# Patient Record
Sex: Male | Born: 1940 | Race: White | Hispanic: No | Marital: Married | Smoking: Former smoker
Health system: Southern US, Community
[De-identification: ages and names within clinical notes are randomized; demographics above are authoritative.]

## PROBLEM LIST (undated history)

## (undated) DIAGNOSIS — K30 Functional dyspepsia: Secondary | ICD-10-CM

## (undated) DIAGNOSIS — M1990 Unspecified osteoarthritis, unspecified site: Secondary | ICD-10-CM

## (undated) DIAGNOSIS — F419 Anxiety disorder, unspecified: Secondary | ICD-10-CM

## (undated) DIAGNOSIS — R0602 Shortness of breath: Secondary | ICD-10-CM

## (undated) DIAGNOSIS — J189 Pneumonia, unspecified organism: Secondary | ICD-10-CM

## (undated) DIAGNOSIS — I10 Essential (primary) hypertension: Secondary | ICD-10-CM

## (undated) DIAGNOSIS — J449 Chronic obstructive pulmonary disease, unspecified: Secondary | ICD-10-CM

## (undated) HISTORY — PX: LEG SURGERY: SHX1003

## (undated) HISTORY — PX: HERNIA REPAIR: SHX51

## (undated) HISTORY — PX: JOINT REPLACEMENT: SHX530

---

## 1993-11-28 HISTORY — PX: NASAL SINUS SURGERY: SHX719

## 1998-12-30 ENCOUNTER — Other Ambulatory Visit: Admission: RE | Admit: 1998-12-30 | Discharge: 1998-12-30 | Payer: Self-pay | Admitting: Specialist

## 2002-03-13 ENCOUNTER — Encounter: Payer: Self-pay | Admitting: Specialist

## 2002-03-13 ENCOUNTER — Encounter: Admission: RE | Admit: 2002-03-13 | Discharge: 2002-03-13 | Payer: Self-pay | Admitting: Specialist

## 2008-01-16 ENCOUNTER — Encounter: Admission: RE | Admit: 2008-01-16 | Discharge: 2008-01-16 | Payer: Self-pay | Admitting: General Surgery

## 2008-01-16 DIAGNOSIS — J9819 Other pulmonary collapse: Secondary | ICD-10-CM

## 2008-01-25 ENCOUNTER — Ambulatory Visit: Payer: Self-pay | Admitting: Internal Medicine

## 2008-01-25 DIAGNOSIS — J4489 Other specified chronic obstructive pulmonary disease: Secondary | ICD-10-CM | POA: Insufficient documentation

## 2008-01-25 DIAGNOSIS — J449 Chronic obstructive pulmonary disease, unspecified: Secondary | ICD-10-CM

## 2008-02-07 ENCOUNTER — Ambulatory Visit: Payer: Self-pay | Admitting: Internal Medicine

## 2008-02-07 ENCOUNTER — Telehealth: Payer: Self-pay | Admitting: Internal Medicine

## 2008-02-07 DIAGNOSIS — R0602 Shortness of breath: Secondary | ICD-10-CM

## 2008-02-07 DIAGNOSIS — R05 Cough: Secondary | ICD-10-CM

## 2008-02-09 ENCOUNTER — Telehealth: Payer: Self-pay | Admitting: Internal Medicine

## 2008-03-03 ENCOUNTER — Telehealth: Payer: Self-pay | Admitting: Internal Medicine

## 2008-04-17 ENCOUNTER — Observation Stay (HOSPITAL_COMMUNITY): Admission: RE | Admit: 2008-04-17 | Discharge: 2008-04-18 | Payer: Self-pay | Admitting: General Surgery

## 2010-07-28 ENCOUNTER — Encounter: Admission: RE | Admit: 2010-07-28 | Discharge: 2010-07-28 | Payer: Self-pay | Admitting: Family Medicine

## 2011-04-12 NOTE — Op Note (Signed)
NAMEASHAN, CUEVA NO.:  [id number]   MEDICAL RECORD NO.:  [id number]          PATIENT TYPE:  OIB   LOCATION:  5152                         FACILITY:  [facility]   PHYSICIAN:  Cherylynn Ridges, M.D.    DATE OF BIRTH:  09-30-1941   DATE OF PROCEDURE:  04/17/2008  DATE OF DISCHARGE:                               OPERATIVE REPORT   PREOPERATIVE DIAGNOSIS:  Umbilical hernia.   POSTOPERATIVE DIAGNOSIS:  A 4-cm umbilical hernia.   PROCEDURE:  Repair of umbilical hernia with mesh.   SURGEON:  Cherylynn Ridges, MD   ANESTHESIA:  General endotracheal.   ESTIMATED BLOOD LOSS:  Less than 20 mL.   COMPLICATIONS:  None.   CONDITION:  Stable.   FINDINGS:  A 4-cm umbilical defect.   INDICATIONS FOR OPERATION:  The patient is a 70 year old obese male with  a large umbilical hernia, comes in for repair.   OPERATION:  The patient was taken to the operating room, placed on table  in supine position.  After an adequate general and endotracheal  anesthetic was administered, he was prepped and draped in usual sterile  manner, exposing his umbilical area.   A midline longitudinal incision was made using a #10 blade from above  the umbilicus to the right and down to below the umbilicus.  We took it  down to the subcutaneous tissue, then dissected out the hernia, and  hernia sac away from the umbilicus to the left medial area with  electrocautery, and blunt and sharp dissection with Metzenbaum scissors.  We identified the hernia sac, and at its base at the fascial level, we  cauterized and excised the sac.  We grabbed the fascial edges with  Kocher clamps, and then developed flaps circumferentially in order to  place a piece of Ultrapro mesh by [place].   We did a primary transverse closure of the fascia using interrupted  figure-of-eight stitches of #1 Novofil.  We then placed a piece of  Ultrapro mesh measuring probably 6 x 3-cm in size in an onlay manner and  across the  fascia, closing and tacking it down with interrupted #1  Novofils, and then doing figure-of-eight stitches across the previously  primarily repaired fashion using #1 Novofil.  Once this was done, the  mesh was completely intact and in place.  We irrigated with antibiotic  solution in which the mesh had been soaked prior to being implanted.  We  then reapproximated the deep subcu with 3-0 Vicryl.  The more  superficial subcu with 3-0 Vicryl, then the skin was  closed using running subcuticular stitch of 4-0 Monocryl.  Marcaine 0.5%  without epinephrine was injected into the wound prior to closure.  Sterile dressing was applied including Dermabond, Steri-Strips, and  Tegaderm.  All counts were correct including needles, sponges, and  instruments.      Cherylynn Ridges, M.D.  Electronically Signed     JOW/MEDQ  D:  04/17/2008  T:  04/18/2008  Job:  [id number]   cc:   Dr. Rodolph Bong

## 2011-08-24 LAB — BASIC METABOLIC PANEL
BUN: 13
CO2: 28
Calcium: 9.5
Chloride: 106
Creatinine, Ser: 0.86
GFR calc Af Amer: >60
GFR calc non Af Amer: >60
Glucose, Bld: 95
Potassium: 4.6
Sodium: 139

## 2011-08-24 LAB — CBC
HCT: 41.8
Hemoglobin: 14.1
MCV: 91.6
Platelets: 228
WBC: 7.6

## 2011-08-24 LAB — DIFFERENTIAL
Eosinophils Absolute: 0.2
Eosinophils Relative: 3
Lymphocytes Relative: 18
Lymphs Abs: 1.4
Monocytes Absolute: 0.9

## 2014-05-05 ENCOUNTER — Other Ambulatory Visit: Payer: Self-pay | Admitting: Orthopedic Surgery

## 2014-05-05 DIAGNOSIS — M25511 Pain in right shoulder: Secondary | ICD-10-CM

## 2014-05-06 ENCOUNTER — Ambulatory Visit
Admission: RE | Admit: 2014-05-06 | Discharge: 2014-05-06 | Disposition: A | Discharge status: Home / Self Care | Payer: Medicare Other | Source: Ambulatory Visit | Attending: Orthopedic Surgery | Admitting: Orthopedic Surgery

## 2014-05-06 DIAGNOSIS — M25511 Pain in right shoulder: Secondary | ICD-10-CM

## 2014-05-26 ENCOUNTER — Other Ambulatory Visit: Payer: Self-pay | Admitting: Orthopedic Surgery

## 2014-05-28 ENCOUNTER — Encounter (HOSPITAL_COMMUNITY): Payer: Self-pay | Admitting: Pharmacy Technician

## 2014-06-02 ENCOUNTER — Encounter (HOSPITAL_COMMUNITY)
Admission: RE | Admit: 2014-06-02 | Discharge: 2014-06-02 | Disposition: A | Discharge status: Home / Self Care | Payer: Medicare Other | Source: Ambulatory Visit | Attending: Orthopedic Surgery | Admitting: Orthopedic Surgery

## 2014-06-02 ENCOUNTER — Ambulatory Visit (HOSPITAL_COMMUNITY)
Admission: RE | Admit: 2014-06-02 | Discharge: 2014-06-02 | Disposition: A | Discharge status: Home / Self Care | Payer: Medicare Other | Source: Ambulatory Visit | Attending: Orthopedic Surgery | Admitting: Orthopedic Surgery

## 2014-06-02 ENCOUNTER — Encounter (HOSPITAL_COMMUNITY): Payer: Self-pay

## 2014-06-02 DIAGNOSIS — Z01818 Encounter for other preprocedural examination: Secondary | ICD-10-CM | POA: Insufficient documentation

## 2014-06-02 DIAGNOSIS — I10 Essential (primary) hypertension: Secondary | ICD-10-CM | POA: Insufficient documentation

## 2014-06-02 DIAGNOSIS — J438 Other emphysema: Secondary | ICD-10-CM | POA: Insufficient documentation

## 2014-06-02 HISTORY — DX: Essential (primary) hypertension: I10

## 2014-06-02 HISTORY — DX: Pneumonia, unspecified organism: J18.9

## 2014-06-02 HISTORY — DX: Chronic obstructive pulmonary disease, unspecified: J44.9

## 2014-06-02 HISTORY — DX: Shortness of breath: R06.02

## 2014-06-02 HISTORY — DX: Unspecified osteoarthritis, unspecified site: M19.90

## 2014-06-02 HISTORY — DX: Functional dyspepsia: K30

## 2014-06-02 HISTORY — DX: Anxiety disorder, unspecified: F41.9

## 2014-06-02 LAB — CBC
HCT: 41.2 % (ref 39.0–52.0)
HEMOGLOBIN: 13 g/dL (ref 13.0–17.0)
MCH: 28.8 pg (ref 26.0–34.0)
MCHC: 31.6 g/dL (ref 30.0–36.0)
MCV: 91.2 fL (ref 78.0–100.0)
Platelets: 209 10*3/uL (ref 150–400)
RBC: 4.52 MIL/uL (ref 4.22–5.81)
RDW: 15.2 % (ref 11.5–15.5)
WBC: 8.7 10*3/uL (ref 4.0–10.5)

## 2014-06-02 LAB — BASIC METABOLIC PANEL
ANION GAP: 11 (ref 5–15)
BUN: 20 mg/dL (ref 6–23)
CHLORIDE: 105 meq/L (ref 96–112)
CO2: 26 mEq/L (ref 19–32)
Calcium: 9.4 mg/dL (ref 8.4–10.5)
Creatinine, Ser: 0.97 mg/dL (ref 0.50–1.35)
GFR calc non Af Amer: 80 mL/min — ABNORMAL LOW (ref >90)
Glucose, Bld: 89 mg/dL (ref 70–99)
POTASSIUM: 4.7 meq/L (ref 3.7–5.3)
Sodium: 142 mEq/L (ref 137–147)

## 2014-06-02 NOTE — Pre-Procedure Instructions (Signed)
Mike Lopez Self  06/02/2014   Your procedure is scheduled on:  06/05/2014  [facility]   - ENTRANCE A    at   10:30AM.  Call this number if you have problems the morning of surgery: [phone]   Remember:   Do not eat food or drink liquids after midnight.  On Wednesday night   Take these medicines the morning of surgery with A SIP OF WATER:   AMLODIPINE, use nebulizer   Do not wear jewelry   Do not wear lotions, powders, or perfumes. You may wear deodorant.    Men may shave face and neck.   Do not bring valuables to the hospital.  [facility] is not responsible  for any belongings or valuables.               Contacts, dentures or bridgework may not be worn into surgery.   Leave suitcase in the car. After surgery it may be brought to your room.   For patients admitted to the hospital, discharge time is determined by your                treatment team.               Patients discharged the day of surgery will not be allowed to drive  home.  Name and phone number of your driver: with wife  Special Instructions: Special Instructions: [place] - Preparing for Surgery  Before surgery, you can play an important role.  Because skin is not sterile, your skin needs to be as free of germs as possible.  You can reduce the number of germs on you skin by washing with CHG (chlorahexidine gluconate) soap before surgery.  CHG is an antiseptic cleaner which kills germs and bonds with the skin to continue killing germs even after washing.  Please DO NOT use if you have an allergy to CHG or antibacterial soaps.  If your skin becomes reddened/irritated stop using the CHG and inform your nurse when you arrive at Short Stay.  Do not shave (including legs and underarms) for at least 48 hours prior to the first CHG shower.  You may shave your face.  Please follow these instructions carefully:   1.  Shower with CHG Soap the night before surgery and the  morning of  Surgery.  2.  If you choose to wash your hair, wash your hair first as usual with your  normal shampoo.  3.  After you shampoo, rinse your hair and body thoroughly to remove the  Shampoo.  4.  Use CHG as you would any other liquid soap.  You can apply chg directly to the skin and wash gently with scrungie or a clean washcloth.  5.  Apply the CHG Soap to your body ONLY FROM THE NECK DOWN.    Do not use on open wounds or open sores.  Avoid contact with your eyes, ears, mouth and genitals (private parts).  Wash genitals (private parts)   with your normal soap.  6.  Wash thoroughly, paying special attention to the area where your surgery will be performed.  7.  Thoroughly rinse your body with warm water from the neck down.  8.  DO NOT shower/wash with your normal soap after using and rinsing off   the CHG Soap.  9.  Pat yourself dry with a clean towel.            10.  Wear clean pajamas.  11.  Place clean sheets on your bed the night of your first shower and do not sleep with pets.  Day of Surgery  Do not apply any lotions/deodorants the morning of surgery.  Please wear clean clothes to the hospital/surgery center.   Please read over the following fact sheets that you were given: Pain Booklet, Coughing and Deep Breathing and Surgical Site Infection Prevention

## 2014-06-02 NOTE — Progress Notes (Addendum)
Pt. Denies chest pain, changes in his chest; admits to SOB at times, not at rest.  Pt. Seen by Dr. Foy GuadalajaraFried, cleared for surgery, also reports that Dr. Foy GuadalajaraFried does breathing challenge test in his office, will request study.

## 2014-06-02 NOTE — Progress Notes (Signed)
06/02/14 1337  OBSTRUCTIVE SLEEP APNEA  Have you ever been diagnosed with sleep apnea through a sleep study? No  Do you snore loudly (loud enough to be heard through closed doors)?  0  Do you often feel tired, fatigued, or sleepy during the daytime? 0  Has anyone observed you stop breathing during your sleep? 0  Do you have, or are you being treated for high blood pressure? 1  BMI more than 35 kg/m2? 1  Age over 74 years old? 1  Neck circumference greater than 40 cm/16 inches? 1  Gender: 1  Obstructive Sleep Apnea Score 5  Score 4 or greater  Results sent to PCP

## 2014-06-02 NOTE — Progress Notes (Signed)
Faxed request to Dr. Pablo LawrenceFried's office for any breathing evaluation.

## 2014-06-04 MED ORDER — DEXTROSE 5 % IV SOLN
3.0000 g | INTRAVENOUS | Status: AC
  Administered 2014-06-05: 3 g via INTRAVENOUS
  Filled 2014-06-04: qty 3000

## 2014-06-04 MED ORDER — POVIDONE-IODINE 7.5 % EX SOLN
Freq: Once | CUTANEOUS | Status: DC
  Filled 2014-06-04: qty 118

## 2014-06-05 ENCOUNTER — Ambulatory Visit (HOSPITAL_COMMUNITY)
Admission: RE | Admit: 2014-06-05 | Discharge: 2014-06-05 | Disposition: A | Discharge status: Home / Self Care | Payer: Medicare Other | Source: Ambulatory Visit | Attending: Orthopedic Surgery | Admitting: Orthopedic Surgery

## 2014-06-05 ENCOUNTER — Encounter (HOSPITAL_COMMUNITY): Payer: Self-pay | Admitting: *Deleted

## 2014-06-05 ENCOUNTER — Encounter (HOSPITAL_COMMUNITY)
Admission: RE | Disposition: A | Discharge status: Home / Self Care | Payer: Self-pay | Source: Ambulatory Visit | Attending: Orthopedic Surgery

## 2014-06-05 ENCOUNTER — Ambulatory Visit (HOSPITAL_COMMUNITY): Payer: Medicare Other | Admitting: Anesthesiology

## 2014-06-05 ENCOUNTER — Encounter (HOSPITAL_COMMUNITY): Payer: Medicare Other | Admitting: Vascular Surgery

## 2014-06-05 DIAGNOSIS — K3189 Other diseases of stomach and duodenum: Secondary | ICD-10-CM | POA: Insufficient documentation

## 2014-06-05 DIAGNOSIS — Z9889 Other specified postprocedural states: Secondary | ICD-10-CM

## 2014-06-05 DIAGNOSIS — Z87891 Personal history of nicotine dependence: Secondary | ICD-10-CM | POA: Insufficient documentation

## 2014-06-05 DIAGNOSIS — M129 Arthropathy, unspecified: Secondary | ICD-10-CM | POA: Insufficient documentation

## 2014-06-05 DIAGNOSIS — R1013 Epigastric pain: Secondary | ICD-10-CM

## 2014-06-05 DIAGNOSIS — X58XXXA Exposure to other specified factors, initial encounter: Secondary | ICD-10-CM | POA: Insufficient documentation

## 2014-06-05 DIAGNOSIS — Z7982 Long term (current) use of aspirin: Secondary | ICD-10-CM | POA: Insufficient documentation

## 2014-06-05 DIAGNOSIS — F411 Generalized anxiety disorder: Secondary | ICD-10-CM | POA: Insufficient documentation

## 2014-06-05 DIAGNOSIS — J438 Other emphysema: Secondary | ICD-10-CM | POA: Insufficient documentation

## 2014-06-05 DIAGNOSIS — I10 Essential (primary) hypertension: Secondary | ICD-10-CM | POA: Insufficient documentation

## 2014-06-05 DIAGNOSIS — Z9981 Dependence on supplemental oxygen: Secondary | ICD-10-CM | POA: Insufficient documentation

## 2014-06-05 DIAGNOSIS — T84029A Dislocation of unspecified internal joint prosthesis, initial encounter: Secondary | ICD-10-CM | POA: Insufficient documentation

## 2014-06-05 DIAGNOSIS — Z96619 Presence of unspecified artificial shoulder joint: Secondary | ICD-10-CM | POA: Insufficient documentation

## 2014-06-05 HISTORY — PX: HARDWARE REMOVAL: SHX979

## 2014-06-05 HISTORY — PX: IRRIGATION AND DEBRIDEMENT SHOULDER: SHX5880

## 2014-06-05 SURGERY — REMOVAL, HARDWARE
Anesthesia: General | Site: Shoulder | Laterality: Right

## 2014-06-05 MED ORDER — PHENYLEPHRINE HCL 10 MG/ML IJ SOLN
10.0000 mg | INTRAMUSCULAR | Status: DC | PRN
  Administered 2014-06-05: 25 ug/min via INTRAVENOUS

## 2014-06-05 MED ORDER — LACTATED RINGERS IV SOLN
INTRAVENOUS | Status: DC | PRN
  Administered 2014-06-05: 12:00:00 via INTRAVENOUS

## 2014-06-05 MED ORDER — FENTANYL CITRATE 0.05 MG/ML IJ SOLN
INTRAMUSCULAR | Status: DC | PRN
  Administered 2014-06-05: 50 ug via INTRAVENOUS

## 2014-06-05 MED ORDER — OXYCODONE HCL 5 MG PO TABS: 5.0000 mg | ORAL_TABLET | Freq: Once | ORAL | Status: DC | PRN

## 2014-06-05 MED ORDER — OXYCODONE-ACETAMINOPHEN 5-325 MG PO TABS: 1.0000 | ORAL_TABLET | ORAL | Status: DC | PRN

## 2014-06-05 MED ORDER — PROMETHAZINE HCL 25 MG/ML IJ SOLN: 6.2500 mg | INTRAMUSCULAR | Status: DC | PRN

## 2014-06-05 MED ORDER — LACTATED RINGERS IV SOLN
INTRAVENOUS | Status: DC
  Administered 2014-06-05: 11:00:00 via INTRAVENOUS

## 2014-06-05 MED ORDER — ARTIFICIAL TEARS OP OINT
TOPICAL_OINTMENT | OPHTHALMIC | Status: AC
  Filled 2014-06-05: qty 3.5

## 2014-06-05 MED ORDER — MIDAZOLAM HCL 2 MG/2ML IJ SOLN
INTRAMUSCULAR | Status: AC
  Administered 2014-06-05: 1 mg
  Filled 2014-06-05: qty 2

## 2014-06-05 MED ORDER — GLYCOPYRROLATE 0.2 MG/ML IJ SOLN
INTRAMUSCULAR | Status: AC
  Filled 2014-06-05: qty 3

## 2014-06-05 MED ORDER — SODIUM CHLORIDE 0.9 % IR SOLN
Status: DC | PRN
  Administered 2014-06-05: 3000 mL

## 2014-06-05 MED ORDER — DOCUSATE SODIUM 100 MG PO CAPS: 100.0000 mg | ORAL_CAPSULE | Freq: Three times a day (TID) | ORAL | Status: AC | PRN

## 2014-06-05 MED ORDER — ROPIVACAINE HCL 5 MG/ML IJ SOLN
INTRAMUSCULAR | Status: DC | PRN
  Administered 2014-06-05: 150 mg via PERINEURAL

## 2014-06-05 MED ORDER — GLYCOPYRROLATE 0.2 MG/ML IJ SOLN
INTRAMUSCULAR | Status: DC | PRN
  Administered 2014-06-05: 0.6 mg via INTRAVENOUS

## 2014-06-05 MED ORDER — PROPOFOL 10 MG/ML IV BOLUS
INTRAVENOUS | Status: AC
  Filled 2014-06-05: qty 20

## 2014-06-05 MED ORDER — FENTANYL CITRATE 0.05 MG/ML IJ SOLN
INTRAMUSCULAR | Status: AC
  Filled 2014-06-05: qty 5

## 2014-06-05 MED ORDER — NEOSTIGMINE METHYLSULFATE 10 MG/10ML IV SOLN
INTRAVENOUS | Status: DC | PRN
  Administered 2014-06-05: 4 mg via INTRAVENOUS

## 2014-06-05 MED ORDER — ROCURONIUM BROMIDE 100 MG/10ML IV SOLN
INTRAVENOUS | Status: DC | PRN
  Administered 2014-06-05: 40 mg via INTRAVENOUS

## 2014-06-05 MED ORDER — ONDANSETRON HCL 4 MG/2ML IJ SOLN
INTRAMUSCULAR | Status: DC | PRN
  Administered 2014-06-05: 4 mg via INTRAVENOUS

## 2014-06-05 MED ORDER — OXYCODONE HCL 5 MG/5ML PO SOLN: 5.0000 mg | Freq: Once | ORAL | Status: DC | PRN

## 2014-06-05 MED ORDER — NEOSTIGMINE METHYLSULFATE 10 MG/10ML IV SOLN
INTRAVENOUS | Status: AC
  Filled 2014-06-05: qty 1

## 2014-06-05 MED ORDER — PROPOFOL 10 MG/ML IV BOLUS
INTRAVENOUS | Status: DC | PRN
  Administered 2014-06-05: 180 mg via INTRAVENOUS

## 2014-06-05 MED ORDER — HYDROMORPHONE HCL PF 1 MG/ML IJ SOLN: 0.2500 mg | INTRAMUSCULAR | Status: DC | PRN

## 2014-06-05 MED ORDER — ARTIFICIAL TEARS OP OINT
TOPICAL_OINTMENT | OPHTHALMIC | Status: DC | PRN
  Administered 2014-06-05: 1 via OPHTHALMIC

## 2014-06-05 MED ORDER — FENTANYL CITRATE 0.05 MG/ML IJ SOLN
INTRAMUSCULAR | Status: AC
  Administered 2014-06-05: 100 ug
  Filled 2014-06-05: qty 2

## 2014-06-05 SURGICAL SUPPLY — 52 items
BOOTCOVER CLEANROOM LRG (PROTECTIVE WEAR) ×6 IMPLANT
CHLORAPREP W/TINT 26ML (MISCELLANEOUS) ×3 IMPLANT
CLOSURE STERI-STRIP 1/2X4 (GAUZE/BANDAGES/DRESSINGS) ×1
CLSR STERI-STRIP ANTIMIC 1/2X4 (GAUZE/BANDAGES/DRESSINGS) ×2 IMPLANT
COVER SURGICAL LIGHT HANDLE (MISCELLANEOUS) ×3 IMPLANT
DRAPE INCISE IOBAN 66X45 STRL (DRAPES) ×3 IMPLANT
DRAPE U-SHAPE 47X51 STRL (DRAPES) ×3 IMPLANT
DRSG MEPILEX BORDER 4X8 (GAUZE/BANDAGES/DRESSINGS) ×3 IMPLANT
DRSG PAD ABDOMINAL 8X10 ST (GAUZE/BANDAGES/DRESSINGS) IMPLANT
ELECT REM PT RETURN 9FT ADLT (ELECTROSURGICAL)
ELECTRODE REM PT RTRN 9FT ADLT (ELECTROSURGICAL) IMPLANT
EVACUATOR 1/8 PVC DRAIN (DRAIN) IMPLANT
GAUZE XEROFORM 1X8 LF (GAUZE/BANDAGES/DRESSINGS) IMPLANT
GLOVE BIO SURGEON STRL SZ7 (GLOVE) ×6 IMPLANT
GLOVE BIOGEL PI IND STRL 6.5 (GLOVE) ×1 IMPLANT
GLOVE BIOGEL PI IND STRL 7.0 (GLOVE) ×1 IMPLANT
GLOVE BIOGEL PI IND STRL 8 (GLOVE) ×1 IMPLANT
GLOVE BIOGEL PI INDICATOR 6.5 (GLOVE) ×2
GLOVE BIOGEL PI INDICATOR 7.0 (GLOVE) ×2
GLOVE BIOGEL PI INDICATOR 8 (GLOVE) ×2
GLOVE ORTHO TXT STRL SZ7.5 (GLOVE) ×3 IMPLANT
GOWN STRL REUS W/ TWL LRG LVL3 (GOWN DISPOSABLE) ×1 IMPLANT
GOWN STRL REUS W/ TWL XL LVL3 (GOWN DISPOSABLE) ×3 IMPLANT
GOWN STRL REUS W/TWL LRG LVL3 (GOWN DISPOSABLE) ×2
GOWN STRL REUS W/TWL XL LVL3 (GOWN DISPOSABLE) ×6
HANDPIECE INTERPULSE COAX TIP (DISPOSABLE) ×2
KIT BASIN OR (CUSTOM PROCEDURE TRAY) ×3 IMPLANT
KIT ROOM TURNOVER OR (KITS) ×3 IMPLANT
MANIFOLD NEPTUNE II (INSTRUMENTS) ×3 IMPLANT
NS IRRIG 1000ML POUR BTL (IV SOLUTION) IMPLANT
PACK SHOULDER (CUSTOM PROCEDURE TRAY) ×3 IMPLANT
PAD ARMBOARD 7.5X6 YLW CONV (MISCELLANEOUS) ×6 IMPLANT
SET HNDPC FAN SPRY TIP SCT (DISPOSABLE) ×1 IMPLANT
SLING ARM IMMOBILIZER LRG (SOFTGOODS) ×3 IMPLANT
SPONGE GAUZE 4X4 12PLY (GAUZE/BANDAGES/DRESSINGS) IMPLANT
SPONGE LAP 18X18 X RAY DECT (DISPOSABLE) ×3 IMPLANT
SUPPORT WRAP ARM LG (MISCELLANEOUS) ×3 IMPLANT
SUT ETHILON 3 0 PS 1 (SUTURE) IMPLANT
SUT PDS AB 0 CT 36 (SUTURE) ×3 IMPLANT
SUT VIC AB 0 CT1 27 (SUTURE)
SUT VIC AB 0 CT1 27XBRD ANBCTR (SUTURE) IMPLANT
SUT VIC AB 2-0 CT1 27 (SUTURE)
SUT VIC AB 2-0 CT1 TAPERPNT 27 (SUTURE) IMPLANT
SWAB COLLECTION DEVICE MRSA (MISCELLANEOUS) ×3 IMPLANT
TOWEL OR 17X24 6PK STRL BLUE (TOWEL DISPOSABLE) ×3 IMPLANT
TOWEL OR 17X26 10 PK STRL BLUE (TOWEL DISPOSABLE) ×3 IMPLANT
TUBE ANAEROBIC SPECIMEN COL (MISCELLANEOUS) ×3 IMPLANT
TUBE CONNECTING 12'X1/4 (SUCTIONS) ×1
TUBE CONNECTING 12X1/4 (SUCTIONS) ×2 IMPLANT
UNDERPAD 30X30 INCONTINENT (UNDERPADS AND DIAPERS) ×3 IMPLANT
WATER STERILE IRR 1000ML POUR (IV SOLUTION) IMPLANT
YANKAUER SUCT BULB TIP NO VENT (SUCTIONS) ×3 IMPLANT

## 2014-06-05 NOTE — Op Note (Signed)
Procedure(s): HARDWARE REMOVAL IRRIGATION AND DEBRIDEMENT SHOULDER Procedure Note  Mike Lopez male 73 y.o. 06/05/2014  Procedure(s) and Anesthesia Type: #1 right shoulder arthrotomy with extensive debridement/synovectomy #2 removal deep implant right shoulder (glenoid component)      Surgeon(s) and Role:    * Mable Paris, MD - Primary   Indications:  73 y.o. 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 He had imaging which revealed extensive bone loss on the glenoid with displacement of the plastic glenoid component anteriorly. She also had posterior instability. After a long discussion he felt that he could live with his level of discomfort and function, but wished to try and decrease the anterior pain he had when reaching forward to shake hands. It was felt this may be at least in part due to the displaced glenoid component stuck anteriorly and it would be worth removing this even if he did not wish to go forward with an extensive reconstructive revision procedure. Risks benefits and alternatives of surgery were discussed at length and the patient wished to go forward with surgery.     Surgeon: Mable Paris   Assistants: Damita Lack PA-C [facility] was present and scrubbed throughout the procedure and was essential in positioning, retraction, exposure, and closure)  Anesthesia: General endotracheal anesthesia with preoperative interscalene block    Procedure Detail  HARDWARE REMOVAL, IRRIGATION AND DEBRIDEMENT SHOULDER  Findings: There was no sign of infection. Deep cultures were taken. The glenoid component was removed. There was severe wear of the glenoid component. All pegs were intact on the component. There is a small amount of cement stuck to the back the component. The glenoid was examined and found to be very deficient, there was not adequate bone stock to  consider revision to reverse shoulder replacement.  Estimated Blood Loss:  less than 100 mL         Drains: 1 medium hemovac  Blood Given: none          Specimens: none        Complications:  * No complications entered in OR log *         Disposition: PACU - hemodynamically stable.         Condition: stable    Procedure:   The patient was identified in the preoperative holding area where I personally marked the operative extremity after verifying with the patient and consent. He  was taken to the operating room where He was transferred to the   operative table.  The patient received an interscalene block in   the holding area by the attending anesthesiologist.  General anesthesia was induced   in the operating room without complication.  The patient did receive IV  Ancef prior to the commencement of the procedure.  The patient was   placed in the beach-chair position with the back raised about 30   degrees.  The nonoperative extremity and head and neck were carefully   positioned and padded protecting against neurovascular compromise.  The   left upper extremity was then prepped and draped in the standard sterile   fashion.    The appropriate operative time-out was performed with   Anesthesia, the perioperative staff, as well as myself and we all agreed   that the right side was the correct operative site.   The central 6 cm of his previous incision were opened sharply. Dissection was carried down through subcutaneous  tissues to the level of the fascia. An attempt was made 5 deltopectoral interval. There was no  identifiable cephalic vein. Using the coracoid is only markedly will 5 deltopectoral interval proximally and traced distally. There is extensive scar tissue and all of his normal anatomic planes. I was very careful to stay lateral to the coracoid and the conjoined tendon which I was able to identify. Anteriorly the capsule was bulging and there was no identifiable  subscapularis. Capsule was opened sharply and there was a large amount of benign-appearing joint fluid which was cultured. Antibiotics were then given. The displaced glenoid component was identified and removed without difficulty. 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 The glenoid was exposed. He had severe medialization and severe loss of glenoid bone stock. There is really no remaining bone stock of the glenoid vault. There was nothing that could possibly support a glenoid component with a revision type surgery. The joint was copiously irrigated with pulse lavage normal saline and subsequently the anterior capsule was closed with 0 PDS suture. Skin was then closed with 2-0 Vicryl and 4-0 Monocryl. Steri-Strips were applied for light dressing.  Patient was allowed to awaken from anesthesia, transferred to stretcher, taken to recovery room in stable condition.  Postoperative plan: He will be observed in the recovery room. As long as he is recovering well from surgery and his respiratory status is satisfactory he could be discharged home today with his wife. If there is any concern he will be observed overnight. He can begin some gentle range of motion exercises within the next 2-3 days.

## 2014-06-05 NOTE — Discharge Instructions (Signed)
Discharge Instructions after Hardware Removal Shoulder  A sling has been provided for you. Remove the sling 5 times each day to perform motion exercises. After the first 48 to 72 hours, discontinue using the sling.  Use ice on the shoulder intermittently over the first 48 hours after surgery.  Pain medication has been prescribed for you.  Use your medication liberally over the first 48 hours, and then begin to taper your use. You may take Extra Strength Tylenol or Tylenol only in place of the pain pills. DO NOT take ANY nonsteroidal anti-inflammatory pain medications: Advil, Motrin, Ibuprofen, Aleve, Naproxen, or Naprosyn. Take one aspirin a day for 2 weeks after surgery, unless you have an aspirin sensitivity/allergy or asthma. You may remove your dressing after two days.  You may shower 5 days after surgery. The incision CANNOT get wet prior to 5 days. Simply allow the water to wash over the site and then pat dry. Do not rub the incision. Make sure your axilla (armpit) is completely dry after showering.   You may use the operative arm for activities of daily living that do not require the operative arm to leave the side of the body, such as eating, drinking, bathing, etc.  Three to 5 times each day you should perform assisted overhead reaching and external rotation (outward turning) exercises with the operative arm. You were taught these exercises prior to discharge. Both exercises should be done with the non-operative arm used as the "therapist arm" while the operative arm remains relaxed. Ten of each exercise should be done three to five times each day.   Overhead reach is helping to lift your stiff arm up as high as it will go. To stretch your overhead reach, lie flat on your back, relax, and grasp the wrist of the tight shoulder with your opposite hand. Using the power in your opposite arm, bring the stiff arm up as far as it is comfortable. Start holding it for ten seconds and then work up to  where you can hold it for a count of 30. Breathe slowly and deeply while the arm is moved. Repeat this stretch ten times, trying to help the ar up a little higher each time.     External rotation is turning the arm out to the side while your elbow stays close to your body. External rotation is best stretched while you are lying on your back. Hold a cane, yardstick, broom handle, or dowel in both hands. Bend both elbows to a right angle. Use steady, gentle force from your normal arm to rotate the hand of the stiff shoulder out away from your body. Continue the rotation as far as it will go comfortably, holding it there for a count of 10. Repeat this exercise ten times.      Please call [phone] during normal business hours or [phone] after hours for any problems. Including the following:  - excessive redness of the incisions - drainage for more than 4 days - fever of more than 101.5 F  *Please note that pain medications will not be refilled after hours or on weekends.   What to eat:  For your first meals, you should eat lightly; only small meals initially.  If you do not have nausea, you may eat larger meals.  Avoid spicy, greasy and heavy food.    General Anesthesia, Adult, Care After  Refer to this sheet in the next few weeks. These instructions provide you with information on caring for yourself after your  procedure. Your health care provider may also give you more specific instructions. Your treatment has been planned according to current medical practices, but problems sometimes occur. Call your health care provider if you have any problems or questions after your procedure.  WHAT TO EXPECT AFTER THE PROCEDURE  After the procedure, it is typical to experience:  Sleepiness.  Nausea and vomiting. HOME CARE INSTRUCTIONS  For the first 24 hours after general anesthesia:  Have a responsible person with you.  Do not drive a car. If you are alone, do not take public  transportation.  Do not drink alcohol.  Do not take medicine that has not been prescribed by your health care provider.  Do not sign important papers or make important decisions.  You may resume a normal diet and activities as directed by your health care provider.  Change bandages (dressings) as directed.  If you have questions or problems that seem related to general anesthesia, call the hospital and ask for the anesthetist or anesthesiologist on call. SEEK MEDICAL CARE IF:  You have nausea and vomiting that continue the day after anesthesia.  You develop a rash. SEEK IMMEDIATE MEDICAL CARE IF:  You have difficulty breathing.  You have chest pain.  You have any allergic problems. Document Released: 02/20/2001 Document Revised: 07/17/2013 Document Reviewed: 05/30/2013  [facility] Patient Information 2014 [place], [place].

## 2014-06-05 NOTE — Anesthesia Postprocedure Evaluation (Signed)
Anesthesia Post Note  Patient: Mike Lopez  Procedure(s) Performed: Procedure(s) (LRB): HARDWARE REMOVAL (Right) IRRIGATION AND DEBRIDEMENT SHOULDER (Right)  Anesthesia type: general  Patient location: PACU  Post pain: Pain level controlled  Post assessment: Patient's Cardiovascular Status Stable  Last Vitals:  Filed Vitals:   06/05/14 1415  BP: 131/67  Pulse: 50  Temp:   Resp: 15    Post vital signs: Reviewed and stable  Level of consciousness: sedated  Complications: No apparent anesthesia complications

## 2014-06-05 NOTE — Anesthesia Procedure Notes (Addendum)
Anesthesia Regional Block:  Interscalene brachial plexus block  Pre-Anesthetic Checklist: ,, timeout performed, Correct Patient, Correct Site, Correct Laterality, Correct Procedure, Correct Position, site marked, Risks and benefits discussed,  Surgical consent,  Pre-op evaluation,  At surgeon's request and post-op pain management  Laterality: Right  Prep: chloraprep       Needles:  Injection technique: Single-shot  Needle Type: Echogenic Stimulator Needle     Needle Length: 5cm 5 cm Needle Gauge: 22 and 22 G    Additional Needles:  Procedures: ultrasound guided (picture in chart) and nerve stimulator Interscalene brachial plexus block  Nerve Stimulator or Paresthesia:  Response: bicep contraction, 0.45 mA,   Additional Responses:   Narrative:  Start time: 06/05/2014 11:12 AM End time: 06/05/2014 11:22 AM Injection made incrementally with aspirations every 5 mL.  Performed by: Personally  Anesthesiologist: J. Adonis Hugueninan Singer, MD  Additional Notes: Functioning IV was confirmed and monitors applied.  A 50mm 22ga echogenic arrow stimulator was used. Sterile prep and drape,hand hygiene and sterile gloves were used.Ultrasound guidance: relevant anatomy identified, needle position confirmed, local anesthetic spread visualized around nerve(s)., vascular puncture avoided.  Image printed for medical record.  Negative aspiration and negative test dose prior to incremental administration of local anesthetic. The patient tolerated the procedure well.   Procedure Name: Intubation Date/Time: 06/05/2014 12:06 PM Performed by: Leonel Ramsay'LAUGHLIN, Hamilton Marinello H Pre-anesthesia Checklist: Patient identified, Timeout performed, Emergency Drugs available, Suction available and Patient being monitored Patient Re-evaluated:Patient Re-evaluated prior to inductionOxygen Delivery Method: Circle system utilized Preoxygenation: Pre-oxygenation with 100% oxygen Intubation Type: IV induction Ventilation: Mask ventilation  without difficulty and Oral airway inserted - appropriate to patient size Laryngoscope Size: Hyacinth MeekerMiller and 2 Grade View: Grade I Tube type: Oral Tube size: 7.5 mm Number of attempts: 1 Airway Equipment and Method: Stylet and Oral airway Placement Confirmation: ETT inserted through vocal cords under direct vision,  positive ETCO2 and breath sounds checked- equal and bilateral Secured at: 22 cm Tube secured with: Tape Dental Injury: Teeth and Oropharynx as per pre-operative assessment

## 2014-06-05 NOTE — Anesthesia Preprocedure Evaluation (Signed)
Anesthesia Evaluation    Reviewed: Allergy & Precautions, H&P , NPO status , Patient's Chart, lab work & pertinent test results  History of Anesthesia Complications Negative for: history of anesthetic complications  Airway       Dental   Pulmonary shortness of breath, pneumonia -, COPD COPD inhaler, former smoker,          Cardiovascular hypertension,     Neuro/Psych PSYCHIATRIC DISORDERS Anxiety    GI/Hepatic negative GI ROS, Neg liver ROS,   Endo/Other  negative endocrine ROS  Renal/GU negative Renal ROS     Musculoskeletal   Abdominal   Peds  Hematology   Anesthesia Other Findings   Reproductive/Obstetrics                           Anesthesia Physical Anesthesia Plan  ASA: III  Anesthesia Plan: General   Post-op Pain Management:    Induction: Intravenous  Airway Management Planned: Oral ETT  Additional Equipment:   Intra-op Plan:   Post-operative Plan: Extubation in OR  Informed Consent:   Plan Discussed with: CRNA, Anesthesiologist and Surgeon  Anesthesia Plan Comments:         Anesthesia Quick Evaluation

## 2014-06-05 NOTE — Transfer of Care (Signed)
Immediate Anesthesia Transfer of Care Note  Patient: Mike Lopez  Procedure(s) Performed: Procedure(s) with comments: HARDWARE REMOVAL (Right) - Right glenoid component removal IRRIGATION AND DEBRIDEMENT SHOULDER (Right)  Patient Location: PACU  Anesthesia Type:General  Level of Consciousness: awake, alert  and oriented  Airway & Oxygen Therapy: Patient Spontanous Breathing and Patient connected to face mask oxygen  Post-op Assessment: Report given to PACU RN and Post -op Vital signs reviewed and stable  Post vital signs: Reviewed and stable  Complications: No apparent anesthesia complications

## 2014-06-05 NOTE — H&P (Signed)
Mike Lopez is an 73 y.o. male.   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  Past Medical History  Diagnosis Date  . Hypertension   . COPD (chronic obstructive pulmonary disease)     h/o emphysema, no oxygen in use   . Shortness of breath   . Pneumonia     hosp. several times for pneumonia   . Anxiety   . Indigestion       medicines bring on indigestion uses water to clear indigestion,  & takes his meds /with dinner rather than bedtime.   . Arthritis     hands, wrists,L knee joint     Past Surgical History  Procedure Laterality Date  . Nasal sinus surgery  1995  . Joint replacement      both shoudlers, Both hips, R knee  . Leg surgery      post trauma, multiple surgries   . Hernia repair      No family history on file. Social History:  reports that he quit smoking about 25 years ago. His smoking use included Cigarettes. He smoked 0.00 packs per day. He does not have any smokeless tobacco history on file. He reports that he does not drink alcohol or use illicit drugs.  Allergies:  Allergies  Allergen Reactions  . Doxycycline Hyclate Other (See Comments)    "My skin comes over my penis."    Medications Prior to Admission  Medication Sig Dispense Refill  . albuterol (PROVENTIL) (2.5 MG/3ML) 0.083% nebulizer solution Take 2.5 mg by nebulization See admin instructions. Use every morning. If active during the evening repeat dose at bedtime.      Marland Kitchen. amLODipine (NORVASC) 10 MG tablet Take 10 mg by mouth every morning.      Marland Kitchen. aspirin EC 81 MG tablet Take 81 mg by mouth daily.      . budesonide (PULMICORT) 0.25 MG/2ML nebulizer solution Take 0.25 mg by nebulization See admin instructions. Use every morning. If active during the evening repeat dose at bedtime.      . cetirizine (KLS ALLER-TEC) 10 MG tablet Take 10 mg by mouth daily.      . Cholecalciferol (VITAMIN D3) 5000 UNITS TABS Take  5,000 Units by mouth daily.      . ciprofloxacin-hydrocortisone (CIPRO HC OTIC) otic suspension 3 drops by Other route 2 (two) times daily. 7 day treatment. Picked up on 05/27/14. Install 3 drops into infected ear twice daily for 7 days.      . Cyanocobalamin (VITAMIN B 12 PO) Take 5,000 mcg by mouth every morning.      . diclofenac (VOLTAREN) 75 MG EC tablet Take 75 mg by mouth 2 (two) times daily.      . folic acid (FOLVITE) 1 MG tablet Take 1 mg by mouth daily.      Marland Kitchen. gabapentin (NEURONTIN) 300 MG capsule Take 600 mg by mouth at bedtime.      Marland Kitchen. losartan (COZAAR) 25 MG tablet Take 25 mg by mouth every morning.      . Multiple Vitamin (MULTIVITAMIN WITH MINERALS) TABS tablet Take 1 tablet by mouth daily.      . NON FORMULARY Take 12 mg by mouth every morning. Hawaiian Astaxanthin      . Omega-3 Fatty Acids (OMEGA 3 PO) Take 1,280 mg by mouth 2 (two) times daily.      Marland Kitchen. OVER THE COUNTER MEDICATION Take 1 tablet by  mouth every morning. Probiotic Eleven        No results found for this or any previous visit (from the past 48 hour(s)). No results found.  Review of Systems  All other systems reviewed and are negative.   Blood pressure 151/67, pulse 64, temperature 98 F (36.7 C), temperature source Oral, resp. rate 14, height 5' 8.5" (1.74 m), weight 110.309 kg (243 lb 3 oz), SpO2 97.00%. Physical Exam  Constitutional: He is oriented to person, place, and time. He appears well-developed and well-nourished.  HENT:  Head: Atraumatic.  Eyes: EOM are normal.  Cardiovascular: Intact distal pulses.   Respiratory: Effort normal.  Musculoskeletal:  Pain with FF R shoulder.  No warmth or erythema.  Neurological: He is alert and oriented to person, place, and time.  Skin: Skin is warm and dry.  Psychiatric: He has a normal mood and affect.     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 Plan removal displaced glenoid Risks /  benefits of surgery discussed Consent on chart  NPO for OR Preop antibiotics   Mike Lopez 06/05/2014, 11:37 AM

## 2014-06-06 ENCOUNTER — Encounter (HOSPITAL_COMMUNITY): Payer: Self-pay | Admitting: Orthopedic Surgery

## 2014-06-06 NOTE — Discharge Summary (Signed)
Patient ID: Mike Lopez MRN: [id number] DOB/AGE: 02-25-1941 73 y.o.  Admit date: 06/05/2014 Discharge date: 06/06/2014  Admission Diagnoses:  Active Problems:   * No active hospital problems. *   Discharge Diagnoses:  Same  Past Medical History  Diagnosis Date  . Hypertension   . COPD (chronic obstructive pulmonary disease)     h/o emphysema, no oxygen in use   . Shortness of breath   . Pneumonia     hosp. several times for pneumonia   . Anxiety   . Indigestion       medicines bring on indigestion uses water to clear indigestion,  & takes his meds /with dinner rather than bedtime.   . Arthritis     hands, wrists,L knee joint     Surgeries: Procedure(s): HARDWARE REMOVAL IRRIGATION AND DEBRIDEMENT SHOULDER on 06/05/2014   Consultants:    Discharged Condition: Improved  Hospital Course: Mike Lopez is an 73 y.o. male who was admitted 06/05/2014 for operative treatment of s/p R shoulder replacement 22 years ago with increased pain and displaced glenoid component. Patient has severe unremitting pain that affects sleep, daily activities, and work/hobbies. After pre-op clearance the patient was taken to the operating room on 06/05/2014 and underwent  Procedure(s): HARDWARE REMOVAL IRRIGATION AND DEBRIDEMENT SHOULDER.    Patient was given perioperative antibiotics: Anti-infectives   Start     Dose/Rate Route Frequency Ordered Stop   06/05/14 0600  ceFAZolin (ANCEF) 3 g in dextrose 5 % 50 mL IVPB     3 g 160 mL/hr over 30 Minutes Intravenous On call to O.R. 06/04/14 1422 06/05/14 1245       Patient was given sequential compression devices, early ambulation to prevent DVT.  Patient benefited maximally from hospital stay and there were no complications.  He was discharged uneventfully from the PACU the evening of surgery.  Recent vital signs: Patient Vitals for the past 24 hrs:  BP Temp Temp src Pulse Resp SpO2 Height Weight  06/05/14 1515 137/67 mmHg - - 51 14 92 % - -   06/05/14 1500 128/58 mmHg - - 47 17 91 % - -  06/05/14 1445 121/64 mmHg 97.6 F (36.4 C) - 46 17 94 % - -  06/05/14 1430 133/62 mmHg - - 44 18 95 % - -  06/05/14 1415 131/67 mmHg - - 50 15 91 % - -  06/05/14 1400 122/64 mmHg - - 51 13 95 % - -  06/05/14 1345 121/66 mmHg - - 56 18 98 % - -  06/05/14 1330 140/65 mmHg 97.7 F (36.5 C) - 65 18 100 % - -  06/05/14 1129 151/67 mmHg - - 64 14 97 % - -  06/05/14 1124 141/72 mmHg - - 60 10 95 % - -  06/05/14 1120 146/78 mmHg - - 62 20 96 % - -  06/05/14 1115 141/68 mmHg - - 58 8 93 % - -  06/05/14 1110 153/76 mmHg - - 55 14 99 % - -  06/05/14 1025 121/63 mmHg 98 F (36.7 C) Oral 70 20 97 % 5' 8.5" (1.74 m) 110.309 kg (243 lb 3 oz)     Recent laboratory studies: No results found for this basename: WBC, HGB, HCT, PLT, NA, K, CL, CO2, BUN, CREATININE, GLUCOSE, PT, INR, CALCIUM, 2,  in the last 72 hours   Discharge Medications:     Medication List         albuterol (2.5 MG/3ML) 0.083% nebulizer solution  Commonly  known as:  PROVENTIL  Take 2.5 mg by nebulization See admin instructions. Use every morning. If active during the evening repeat dose at bedtime.     amLODipine 10 MG tablet  Commonly known as:  NORVASC  Take 10 mg by mouth every morning.     aspirin EC 81 MG tablet  Take 81 mg by mouth daily.     budesonide 0.25 MG/2ML nebulizer solution  Commonly known as:  PULMICORT  Take 0.25 mg by nebulization See admin instructions. Use every morning. If active during the evening repeat dose at bedtime.     ciprofloxacin-hydrocortisone otic suspension  Commonly known as:  CIPRO HC OTIC  - 3 drops by Other route 2 (two) times daily. 7 day treatment. Picked up on 05/27/14.  - Install 3 drops into infected ear twice daily for 7 days.     diclofenac 75 MG EC tablet  Commonly known as:  VOLTAREN  Take 75 mg by mouth 2 (two) times daily.     docusate sodium 100 MG capsule  Commonly known as:  COLACE  Take 1 capsule (100 mg total) by  mouth 3 (three) times daily as needed.     folic acid 1 MG tablet  Commonly known as:  FOLVITE  Take 1 mg by mouth daily.     gabapentin 300 MG capsule  Commonly known as:  NEURONTIN  Take 600 mg by mouth at bedtime.     KLS ALLER-TEC 10 MG tablet  Generic drug:  cetirizine  Take 10 mg by mouth daily.     losartan 25 MG tablet  Commonly known as:  COZAAR  Take 25 mg by mouth every morning.     multivitamin with minerals Tabs tablet  Take 1 tablet by mouth daily.     NON FORMULARY  Take 12 mg by mouth every morning. Hawaiian Astaxanthin     OMEGA 3 PO  Take 1,280 mg by mouth 2 (two) times daily.     OVER THE COUNTER MEDICATION  Take 1 tablet by mouth every morning. Probiotic Eleven     oxyCODONE-acetaminophen 5-325 MG per tablet  Commonly known as:  ROXICET  Take 1-2 tablets by mouth every 4 (four) hours as needed for severe pain.     VITAMIN B 12 PO  Take 5,000 mcg by mouth every morning.     Vitamin D3 5000 UNITS Tabs  Take 5,000 Units by mouth daily.        Diagnostic Studies: Dg Chest 2 View  06/02/2014   CLINICAL DATA:  Preoperative evaluation ; hypertension; emphysema  EXAM: CHEST  2 VIEW  COMPARISON:  September 06, 2010  FINDINGS: There is underlying emphysema. There is atelectatic change in each lung base. Lungs are otherwise clear. The heart size and pulmonary vascularity are normal. No adenopathy. There are total shoulder replacements bilaterally. There is degenerative change in the thoracic spine. There is an old healed fracture of the right clavicle. There is acromioclavicular separation on the left, likely chronic.  IMPRESSION: Underlying emphysema. Bibasilar atelectatic change. Elsewhere, lungs are clear.   Electronically Signed   By: Bretta Bang M.D.   On: 06/02/2014 14:54    Disposition: 01-Home or Self Care      Discharge Instructions   Call MD / Call 911    Complete by:  As directed   If you experience chest pain or shortness of breath, CALL  911 and be transported to the hospital emergency room.  If you develope a fever  above 101 F, pus (white drainage) or increased drainage or redness at the wound, or calf pain, call your surgeon's office.     Constipation Prevention    Complete by:  As directed   Drink plenty of fluids.  Prune juice may be helpful.  You may use a stool softener, such as Colace (over the counter) 100 mg twice a day.  Use MiraLax (over the counter) for constipation as needed.     Diet - low sodium heart healthy    Complete by:  As directed      Increase activity slowly as tolerated    Complete by:  As directed            Follow-up Information   Follow up with Mable ParisHANDLER,JUSTIN WILLIAM, MD. Schedule an appointment as soon as possible for a visit in 2 weeks.   Specialty:  Orthopedic Surgery   Contact information:   [address] [place] [id number] [phone]        Signed: Jiles HaroldLALIBERTE, Julitza Rickles 06/06/2014, 7:55 AM

## 2014-06-07 LAB — WOUND CULTURE: Culture: NO GROWTH

## 2014-06-20 LAB — ANAEROBIC CULTURE

## 2014-07-12 ENCOUNTER — Encounter (HOSPITAL_COMMUNITY)
Admission: EM | Disposition: A | Discharge status: Home / Self Care | Payer: Self-pay | Source: Home / Self Care | Attending: Emergency Medicine

## 2014-07-12 ENCOUNTER — Encounter (HOSPITAL_COMMUNITY): Payer: Self-pay | Admitting: Emergency Medicine

## 2014-07-12 ENCOUNTER — Ambulatory Visit (HOSPITAL_COMMUNITY)
Admission: EM | Admit: 2014-07-12 | Discharge: 2014-07-12 | Disposition: A | Discharge status: Home / Self Care | Payer: Medicare Other | Attending: Emergency Medicine | Admitting: Emergency Medicine

## 2014-07-12 ENCOUNTER — Emergency Department (HOSPITAL_COMMUNITY): Payer: Medicare Other | Admitting: Certified Registered Nurse Anesthetist

## 2014-07-12 ENCOUNTER — Emergency Department (HOSPITAL_COMMUNITY): Payer: Medicare Other

## 2014-07-12 ENCOUNTER — Encounter (HOSPITAL_COMMUNITY): Payer: Medicare Other | Admitting: Certified Registered Nurse Anesthetist

## 2014-07-12 DIAGNOSIS — I10 Essential (primary) hypertension: Secondary | ICD-10-CM | POA: Insufficient documentation

## 2014-07-12 DIAGNOSIS — M19049 Primary osteoarthritis, unspecified hand: Secondary | ICD-10-CM | POA: Diagnosis not present

## 2014-07-12 DIAGNOSIS — J449 Chronic obstructive pulmonary disease, unspecified: Secondary | ICD-10-CM | POA: Insufficient documentation

## 2014-07-12 DIAGNOSIS — Z79899 Other long term (current) drug therapy: Secondary | ICD-10-CM | POA: Insufficient documentation

## 2014-07-12 DIAGNOSIS — Z87891 Personal history of nicotine dependence: Secondary | ICD-10-CM | POA: Diagnosis not present

## 2014-07-12 DIAGNOSIS — M1710 Unilateral primary osteoarthritis, unspecified knee: Secondary | ICD-10-CM | POA: Diagnosis not present

## 2014-07-12 DIAGNOSIS — Y92410 Unspecified street and highway as the place of occurrence of the external cause: Secondary | ICD-10-CM | POA: Insufficient documentation

## 2014-07-12 DIAGNOSIS — Z881 Allergy status to other antibiotic agents status: Secondary | ICD-10-CM | POA: Diagnosis not present

## 2014-07-12 DIAGNOSIS — M19039 Primary osteoarthritis, unspecified wrist: Secondary | ICD-10-CM | POA: Diagnosis not present

## 2014-07-12 DIAGNOSIS — J4489 Other specified chronic obstructive pulmonary disease: Secondary | ICD-10-CM | POA: Insufficient documentation

## 2014-07-12 DIAGNOSIS — S73006A Unspecified dislocation of unspecified hip, initial encounter: Secondary | ICD-10-CM | POA: Diagnosis present

## 2014-07-12 DIAGNOSIS — Z96649 Presence of unspecified artificial hip joint: Secondary | ICD-10-CM | POA: Insufficient documentation

## 2014-07-12 DIAGNOSIS — T84029A Dislocation of unspecified internal joint prosthesis, initial encounter: Secondary | ICD-10-CM | POA: Insufficient documentation

## 2014-07-12 DIAGNOSIS — S73004A Unspecified dislocation of right hip, initial encounter: Secondary | ICD-10-CM

## 2014-07-12 DIAGNOSIS — IMO0002 Reserved for concepts with insufficient information to code with codable children: Secondary | ICD-10-CM

## 2014-07-12 DIAGNOSIS — F411 Generalized anxiety disorder: Secondary | ICD-10-CM | POA: Diagnosis not present

## 2014-07-12 DIAGNOSIS — X500XXA Overexertion from strenuous movement or load, initial encounter: Secondary | ICD-10-CM | POA: Insufficient documentation

## 2014-07-12 HISTORY — PX: HIP CLOSED REDUCTION: SHX983

## 2014-07-12 LAB — CBC
HEMATOCRIT: 42.2 % (ref 39.0–52.0)
HEMOGLOBIN: 13.3 g/dL (ref 13.0–17.0)
MCH: 28.4 pg (ref 26.0–34.0)
MCHC: 31.5 g/dL (ref 30.0–36.0)
MCV: 90 fL (ref 78.0–100.0)
Platelets: 202 10*3/uL (ref 150–400)
RBC: 4.69 MIL/uL (ref 4.22–5.81)
RDW: 14.5 % (ref 11.5–15.5)
WBC: 8.6 10*3/uL (ref 4.0–10.5)

## 2014-07-12 LAB — BASIC METABOLIC PANEL
Anion gap: 11 (ref 5–15)
BUN: 18 mg/dL (ref 6–23)
CHLORIDE: 107 meq/L (ref 96–112)
CO2: 26 mEq/L (ref 19–32)
Calcium: 9.3 mg/dL (ref 8.4–10.5)
Creatinine, Ser: 0.94 mg/dL (ref 0.50–1.35)
GFR, EST NON AFRICAN AMERICAN: 81 mL/min — AB (ref >90)
GLUCOSE: 93 mg/dL (ref 70–99)
Potassium: 4.3 mEq/L (ref 3.7–5.3)
SODIUM: 144 meq/L (ref 137–147)

## 2014-07-12 LAB — URINALYSIS, ROUTINE W REFLEX MICROSCOPIC
Bilirubin Urine: NEGATIVE
GLUCOSE, UA: NEGATIVE mg/dL
HGB URINE DIPSTICK: NEGATIVE
Ketones, ur: 15 mg/dL — AB
LEUKOCYTES UA: NEGATIVE
Nitrite: NEGATIVE
PROTEIN: NEGATIVE mg/dL
Specific Gravity, Urine: 1.017 (ref 1.005–1.030)
UROBILINOGEN UA: 0.2 mg/dL (ref 0.0–1.0)
pH: 5.5 (ref 5.0–8.0)

## 2014-07-12 LAB — ABO/RH: ABO/RH(D): O POS

## 2014-07-12 LAB — TYPE AND SCREEN
ABO/RH(D): O POS
ANTIBODY SCREEN: NEGATIVE

## 2014-07-12 SURGERY — CLOSED REDUCTION, HIP
Anesthesia: General | Site: Hip | Laterality: Right

## 2014-07-12 MED ORDER — FENTANYL CITRATE 0.05 MG/ML IJ SOLN
INTRAMUSCULAR | Status: AC
  Filled 2014-07-12: qty 5

## 2014-07-12 MED ORDER — FENTANYL CITRATE 0.05 MG/ML IJ SOLN: 25.0000 ug | INTRAMUSCULAR | Status: DC | PRN

## 2014-07-12 MED ORDER — LACTATED RINGERS IV SOLN
INTRAVENOUS | Status: DC | PRN
  Administered 2014-07-12: 08:00:00 via INTRAVENOUS

## 2014-07-12 MED ORDER — OXYCODONE HCL 5 MG/5ML PO SOLN: 5.0000 mg | Freq: Once | ORAL | Status: DC | PRN

## 2014-07-12 MED ORDER — LIDOCAINE HCL (CARDIAC) 20 MG/ML IV SOLN
INTRAVENOUS | Status: DC | PRN
  Administered 2014-07-12: 40 mg via INTRAVENOUS

## 2014-07-12 MED ORDER — KCL IN DEXTROSE-NACL 20-5-0.45 MEQ/L-%-% IV SOLN
Freq: Once | INTRAVENOUS | Status: AC
  Administered 2014-07-12: 06:00:00 via INTRAVENOUS
  Filled 2014-07-12: qty 1000

## 2014-07-12 MED ORDER — OXYCODONE HCL 5 MG PO TABS: 5.0000 mg | ORAL_TABLET | Freq: Once | ORAL | Status: DC | PRN

## 2014-07-12 MED ORDER — ROCURONIUM BROMIDE 50 MG/5ML IV SOLN
INTRAVENOUS | Status: AC
  Filled 2014-07-12: qty 1

## 2014-07-12 MED ORDER — LACTATED RINGERS IV SOLN
INTRAVENOUS | Status: DC
  Administered 2014-07-12: 07:00:00 via INTRAVENOUS

## 2014-07-12 MED ORDER — MIDAZOLAM HCL 2 MG/2ML IJ SOLN
INTRAMUSCULAR | Status: AC
  Filled 2014-07-12: qty 2

## 2014-07-12 MED ORDER — FENTANYL CITRATE 0.05 MG/ML IJ SOLN
INTRAMUSCULAR | Status: DC | PRN
  Administered 2014-07-12: 100 ug via INTRAVENOUS

## 2014-07-12 MED ORDER — ONDANSETRON HCL 4 MG/2ML IJ SOLN
INTRAMUSCULAR | Status: AC
  Filled 2014-07-12: qty 2

## 2014-07-12 MED ORDER — ONDANSETRON HCL 4 MG/2ML IJ SOLN: 4.0000 mg | Freq: Once | INTRAMUSCULAR | Status: DC | PRN

## 2014-07-12 MED ORDER — PROPOFOL 10 MG/ML IV BOLUS
INTRAVENOUS | Status: DC | PRN
  Administered 2014-07-12: 180 mg via INTRAVENOUS

## 2014-07-12 MED ORDER — SUCCINYLCHOLINE CHLORIDE 20 MG/ML IJ SOLN
INTRAMUSCULAR | Status: DC | PRN
  Administered 2014-07-12: 120 mg via INTRAVENOUS

## 2014-07-12 MED ORDER — PROPOFOL 10 MG/ML IV BOLUS
INTRAVENOUS | Status: AC
  Filled 2014-07-12: qty 20

## 2014-07-12 MED ORDER — LIDOCAINE HCL (CARDIAC) 20 MG/ML IV SOLN
INTRAVENOUS | Status: AC
  Filled 2014-07-12: qty 5

## 2014-07-12 SURGICAL SUPPLY — 1 items: IMMOBILIZER KNEE 22 UNIV (SOFTGOODS) ×3 IMPLANT
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
## 2014-07-12 NOTE — Anesthesia Preprocedure Evaluation (Addendum)
Anesthesia Evaluation  Patient identified by MRN, date of birth, ID band Patient awake    Reviewed: Allergy & Precautions, H&P , NPO status , Patient's Chart, lab work & pertinent test results  Airway Mallampati: II TM Distance: >3 FB Neck ROM: Full    Dental   Pulmonary former smoker,          Cardiovascular hypertension, Pt. on medications Rhythm:Regular Rate:Normal     Neuro/Psych    GI/Hepatic   Endo/Other    Renal/GU      Musculoskeletal   Abdominal   Peds  Hematology   Anesthesia Other Findings   Reproductive/Obstetrics                         Anesthesia Physical Anesthesia Plan  ASA: II  Anesthesia Plan: General   Post-op Pain Management:    Induction: Intravenous  Airway Management Planned: Oral ETT  Additional Equipment:   Intra-op Plan:   Post-operative Plan: Extubation in OR  Informed Consent: I have reviewed the patients History and Physical, chart, labs and discussed the procedure including the risks, benefits and alternatives for the proposed anesthesia with the patient or authorized representative who has indicated his/her understanding and acceptance.   Dental advisory given  Plan Discussed with: Anesthesiologist and Surgeon  Anesthesia Plan Comments: (Dislocated R. THR Hypertension  Plan GA with oral ETT  Kipp Broodavid Lashona Schaaf)       Anesthesia Quick Evaluation

## 2014-07-12 NOTE — ED Notes (Addendum)
Pt transported to the OR at this time. Report given to [employer] RN

## 2014-07-12 NOTE — Transfer of Care (Signed)
Immediate Anesthesia Transfer of Care Note  Patient: Mike Lopez  Procedure(s) Performed: Procedure(s): CLOSED REDUCTION HIP (Right)  Patient Location: PACU  Anesthesia Type:General  Level of Consciousness: awake, alert  and oriented  Airway & Oxygen Therapy: Patient Spontanous Breathing  Post-op Assessment: Report given to PACU RN  Post vital signs: Reviewed and stable  Complications: No apparent anesthesia complications
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
## 2014-07-12 NOTE — H&P (Signed)
Mike Lopez is an 73 y.o. male.   Chief Complaint: R hip dislocation HPI: s/p revision hip arthroplasty 9/14 at [facility].  Putting leg over motorcycle today and felt pop.   Past Medical History  Diagnosis Date  . Hypertension   . COPD (chronic obstructive pulmonary disease)     h/o emphysema, no oxygen in use   . Shortness of breath   . Pneumonia     hosp. several times for pneumonia   . Anxiety   . Indigestion       medicines bring on indigestion uses water to clear indigestion,  & takes his meds /with dinner rather than bedtime.   . Arthritis     hands, wrists,L knee joint     Past Surgical History  Procedure Laterality Date  . Nasal sinus surgery  1995  . Joint replacement      both shoudlers, Both hips, R knee  . Leg surgery      post trauma, multiple surgries   . Hernia repair    . Hardware removal Right 06/05/2014    Procedure: HARDWARE REMOVAL;  Surgeon: Nita Sells, MD;  Location: [place];  Service: Orthopedics;  Laterality: Right;  Right glenoid component removal  . Irrigation and debridement shoulder Right 06/05/2014    Procedure: IRRIGATION AND DEBRIDEMENT SHOULDER;  Surgeon: Nita Sells, MD;  Location: [place];  Service: Orthopedics;  Laterality: Right;    No family history on file. Social History:  reports that he quit smoking about 25 years ago. His smoking use included Cigarettes. He smoked 0.00 packs per day. He does not have any smokeless tobacco history on file. He reports that he does not drink alcohol or use illicit drugs.  Allergies:  Allergies  Allergen Reactions  . Doxycycline Hyclate Other (See Comments)    "My skin comes over my penis."    Medications Prior to Admission  Medication Sig Dispense Refill  . albuterol (PROVENTIL) (2.5 MG/3ML) 0.083% nebulizer solution Take 2.5 mg by nebulization See admin instructions. Use every morning. If active during the evening repeat dose at bedtime.      Marland Kitchen amLODipine (NORVASC) 10 MG tablet  Take 10 mg by mouth every morning.      Marland Kitchen aspirin EC 81 MG tablet Take 81 mg by mouth daily.      . budesonide (PULMICORT) 0.25 MG/2ML nebulizer solution Take 0.25 mg by nebulization See admin instructions. Use every morning. If active during the evening repeat dose at bedtime.      . cetirizine (KLS ALLER-TEC) 10 MG tablet Take 10 mg by mouth daily.      . Cholecalciferol (VITAMIN D3) 5000 UNITS TABS Take 5,000 Units by mouth daily.      . Cyanocobalamin (VITAMIN B 12 PO) Take 5,000 mcg by mouth every morning.      . diclofenac (VOLTAREN) 75 MG EC tablet Take 75 mg by mouth 2 (two) times daily.      Marland Kitchen docusate sodium (COLACE) 100 MG capsule Take 1 capsule (100 mg total) by mouth 3 (three) times daily as needed.  20 capsule  0  . folic acid (FOLVITE) 1 MG tablet Take 1 mg by mouth daily.      Marland Kitchen gabapentin (NEURONTIN) 300 MG capsule Take 600 mg by mouth at bedtime.      Marland Kitchen losartan (COZAAR) 25 MG tablet Take 25 mg by mouth every morning.      . Multiple Vitamin (MULTIVITAMIN WITH MINERALS) TABS tablet Take 1 tablet  by mouth daily.      . NON FORMULARY Take 12 mg by mouth every morning. Hawaiian Astaxanthin      . Omega-3 Fatty Acids (OMEGA 3 PO) Take 1,280 mg by mouth 2 (two) times daily.      Marland Kitchen OVER THE COUNTER MEDICATION Take 1 tablet by mouth every morning. Probiotic Eleven        Results for orders placed during the hospital encounter of 07/12/14 (from the past 48 hour(s))  CBC     Status: None   Collection Time    07/12/14  5:15 AM      Result Value Ref Range   WBC 8.6  4.0 - 10.5 K/uL   RBC 4.69  4.22 - 5.81 MIL/uL   Hemoglobin 13.3  13.0 - 17.0 g/dL   HCT 42.2  39.0 - 52.0 %   MCV 90.0  78.0 - 100.0 fL   MCH 28.4  26.0 - 34.0 pg   MCHC 31.5  30.0 - 36.0 g/dL   RDW 14.5  11.5 - 15.5 %   Platelets 202  150 - 400 K/uL  BASIC METABOLIC PANEL     Status: Abnormal   Collection Time    07/12/14  5:15 AM      Result Value Ref Range   Sodium 144  137 - 147 mEq/L   Potassium 4.3  3.7 -  5.3 mEq/L   Chloride 107  96 - 112 mEq/L   CO2 26  19 - 32 mEq/L   Glucose, Bld 93  70 - 99 mg/dL   BUN 18  6 - 23 mg/dL   Creatinine, Ser 0.94  0.50 - 1.35 mg/dL   Calcium 9.3  8.4 - 10.5 mg/dL   GFR calc non Af Amer 81 (*) >90 mL/min   GFR calc Af Amer >90  >90 mL/min   Comment: (NOTE)     The eGFR has been calculated using the CKD EPI equation.     This calculation has not been validated in all clinical situations.     eGFR's persistently <90 mL/min signify possible Chronic Kidney     Disease.   Anion gap 11  5 - 15  TYPE AND SCREEN     Status: None   Collection Time    07/12/14  5:16 AM      Result Value Ref Range   ABO/RH(D) O POS     Antibody Screen NEG     Sample Expiration 07/15/2014    ABO/RH     Status: None   Collection Time    07/12/14  5:16 AM      Result Value Ref Range   ABO/RH(D) O POS    URINALYSIS, ROUTINE W REFLEX MICROSCOPIC     Status: Abnormal   Collection Time    07/12/14  5:33 AM      Result Value Ref Range   Color, Urine YELLOW  YELLOW   APPearance HAZY (*) CLEAR   Specific Gravity, Urine 1.017  1.005 - 1.030   pH 5.5  5.0 - 8.0   Glucose, UA NEGATIVE  NEGATIVE mg/dL   Hgb urine dipstick NEGATIVE  NEGATIVE   Bilirubin Urine NEGATIVE  NEGATIVE   Ketones, ur 15 (*) NEGATIVE mg/dL   Protein, ur NEGATIVE  NEGATIVE mg/dL   Urobilinogen, UA 0.2  0.0 - 1.0 mg/dL   Nitrite NEGATIVE  NEGATIVE   Leukocytes, UA NEGATIVE  NEGATIVE   Comment: MICROSCOPIC NOT DONE ON URINES WITH NEGATIVE PROTEIN, BLOOD, LEUKOCYTES, NITRITE,  OR GLUCOSE <1000 mg/dL.   Dg Chest 1 View  07/12/2014   CLINICAL DATA:  Preoperative chest radiograph.  EXAM: CHEST - 1 VIEW  COMPARISON:  Chest radiographs performed 06/02/2014  FINDINGS: The lungs are well-aerated. Vascular congestion is noted. Mild left basilar opacity likely reflects atelectasis. There is no evidence of pleural effusion or pneumothorax.  The cardiomediastinal silhouette is borderline normal in size. No acute osseous  abnormalities are seen. A right shoulder arthroplasty is partially imaged, and appears grossly unremarkable. There is chronic deformity of the right clavicle.  IMPRESSION: Vascular congestion noted. Mild left basilar airspace opacity likely reflects atelectasis.   Electronically Signed   By: Garald Balding M.D.   On: 07/12/2014 04:21   Dg Hip Complete Right  07/12/2014   CLINICAL DATA:  Hip injury.  EXAM: RIGHT HIP - COMPLETE 2+ VIEW  COMPARISON:  None.  FINDINGS: Bilateral hip total arthroplasties, right hip dislocation, the femoral head projects above the acetabular cup. Lateral radiograph was unable to be obtained. No definite fracture deformity. No periprosthetic lucency. No destructive bony lesions. Heterotopic ossification about the right hip. Surgical clip projects in the right scrotum.  IMPRESSION: Status post bilateral hip total arthroplasties, dislocated right hip prosthesis. No definite fracture deformity though, lateral radiograph with unable to be obtained.   Electronically Signed   By: Elon Alas   On: 07/12/2014 04:43    Review of Systems  All other systems reviewed and are negative.   Blood pressure 160/78, pulse 80, temperature 97.5 F (36.4 C), temperature source Oral, resp. rate 20, SpO2 97.00%. Physical Exam  Constitutional: He is oriented to person, place, and time. He appears well-developed and well-nourished.  HENT:  Head: Atraumatic.  Eyes: EOM are normal.  Cardiovascular: Intact distal pulses.   Respiratory: Effort normal.  Musculoskeletal:  RLE shortened and ER  Neurological: He is alert and oriented to person, place, and time.  Skin: Skin is warm and dry.  Psychiatric: He has a normal mood and affect.     Assessment/Plan R hip dislocation Plan CR in OR Risks / benefits of procedure discussed Consent on chart  NPO for OR    Nadiah Corbit WILLIAM 07/12/2014, 8:04 AM

## 2014-07-12 NOTE — Anesthesia Postprocedure Evaluation (Signed)
  Anesthesia Post-op Note  Patient: Mike Lopez  Procedure(s) Performed: Procedure(s): CLOSED REDUCTION HIP (Right)  Patient Location: PACU  Anesthesia Type:General  Level of Consciousness: awake, alert  and oriented  Airway and Oxygen Therapy: Patient Spontanous Breathing  Post-op Pain: none  Post-op Assessment: Post-op Vital signs reviewed, Patient's Cardiovascular Status Stable, Respiratory Function Stable, Patent Airway and Pain level controlled  Post-op Vital Signs: stable  Last Vitals:  Filed Vitals:   07/12/14 0932  BP: 151/77  Pulse: 73  Temp: 36.6 C  Resp: 24    Complications: No apparent anesthesia complications

## 2014-07-14 ENCOUNTER — Encounter (HOSPITAL_COMMUNITY): Payer: Self-pay | Admitting: Orthopedic Surgery

## 2014-07-25 ENCOUNTER — Encounter (HOSPITAL_COMMUNITY): Payer: Self-pay | Admitting: Anesthesiology

## 2014-07-25 ENCOUNTER — Encounter (HOSPITAL_COMMUNITY): Payer: Medicare Other | Admitting: Registered Nurse

## 2014-07-25 ENCOUNTER — Ambulatory Visit (HOSPITAL_COMMUNITY): Payer: Medicare Other

## 2014-07-25 ENCOUNTER — Ambulatory Visit (HOSPITAL_COMMUNITY): Payer: Medicare Other | Admitting: Registered Nurse

## 2014-07-25 ENCOUNTER — Encounter (HOSPITAL_COMMUNITY)
Admission: RE | Disposition: A | Discharge status: Home / Self Care | Payer: Self-pay | Source: Other Acute Inpatient Hospital | Attending: Orthopedic Surgery

## 2014-07-25 ENCOUNTER — Ambulatory Visit (HOSPITAL_COMMUNITY)
Admission: RE | Admit: 2014-07-25 | Discharge: 2014-07-26 | Disposition: A | Discharge status: Home / Self Care | Payer: Medicare Other | Source: Other Acute Inpatient Hospital | Attending: Orthopedic Surgery | Admitting: Orthopedic Surgery

## 2014-07-25 DIAGNOSIS — F411 Generalized anxiety disorder: Secondary | ICD-10-CM | POA: Insufficient documentation

## 2014-07-25 DIAGNOSIS — S73004A Unspecified dislocation of right hip, initial encounter: Secondary | ICD-10-CM

## 2014-07-25 DIAGNOSIS — Z7982 Long term (current) use of aspirin: Secondary | ICD-10-CM | POA: Diagnosis not present

## 2014-07-25 DIAGNOSIS — Z79899 Other long term (current) drug therapy: Secondary | ICD-10-CM | POA: Insufficient documentation

## 2014-07-25 DIAGNOSIS — E669 Obesity, unspecified: Secondary | ICD-10-CM | POA: Insufficient documentation

## 2014-07-25 DIAGNOSIS — Z96619 Presence of unspecified artificial shoulder joint: Secondary | ICD-10-CM | POA: Insufficient documentation

## 2014-07-25 DIAGNOSIS — M1990 Unspecified osteoarthritis, unspecified site: Secondary | ICD-10-CM | POA: Diagnosis not present

## 2014-07-25 DIAGNOSIS — Z96649 Presence of unspecified artificial hip joint: Secondary | ICD-10-CM | POA: Insufficient documentation

## 2014-07-25 DIAGNOSIS — I10 Essential (primary) hypertension: Secondary | ICD-10-CM | POA: Diagnosis not present

## 2014-07-25 DIAGNOSIS — J449 Chronic obstructive pulmonary disease, unspecified: Secondary | ICD-10-CM | POA: Diagnosis not present

## 2014-07-25 DIAGNOSIS — T84029A Dislocation of unspecified internal joint prosthesis, initial encounter: Secondary | ICD-10-CM | POA: Diagnosis not present

## 2014-07-25 DIAGNOSIS — Y831 Surgical operation with implant of artificial internal device as the cause of abnormal reaction of the patient, or of later complication, without mention of misadventure at the time of the procedure: Secondary | ICD-10-CM | POA: Diagnosis not present

## 2014-07-25 DIAGNOSIS — Z87891 Personal history of nicotine dependence: Secondary | ICD-10-CM | POA: Diagnosis not present

## 2014-07-25 DIAGNOSIS — Z96659 Presence of unspecified artificial knee joint: Secondary | ICD-10-CM | POA: Insufficient documentation

## 2014-07-25 DIAGNOSIS — J4489 Other specified chronic obstructive pulmonary disease: Secondary | ICD-10-CM | POA: Insufficient documentation

## 2014-07-25 HISTORY — PX: HIP CLOSED REDUCTION: SHX983

## 2014-07-25 SURGERY — CLOSED MANIPULATION, JOINT, HIP
Anesthesia: General | Laterality: Right

## 2014-07-25 MED ORDER — PROPOFOL 10 MG/ML IV BOLUS
INTRAVENOUS | Status: DC | PRN
  Administered 2014-07-25: 170 mg via INTRAVENOUS

## 2014-07-25 MED ORDER — HYDROCODONE-ACETAMINOPHEN 5-325 MG PO TABS: 1.0000 | ORAL_TABLET | ORAL | Status: DC | PRN

## 2014-07-25 MED ORDER — SUCCINYLCHOLINE CHLORIDE 20 MG/ML IJ SOLN
INTRAMUSCULAR | Status: DC | PRN
  Administered 2014-07-25: 100 mg via INTRAVENOUS
  Administered 2014-07-25: 60 mg via INTRAVENOUS

## 2014-07-25 MED ORDER — MEPERIDINE HCL 50 MG/ML IJ SOLN
6.2500 mg | INTRAMUSCULAR | Status: DC | PRN
Start: 2014-07-25 — End: 2014-07-26

## 2014-07-25 MED ORDER — PROMETHAZINE HCL 25 MG/ML IJ SOLN: 6.2500 mg | INTRAMUSCULAR | Status: DC | PRN

## 2014-07-25 MED ORDER — FENTANYL CITRATE 0.05 MG/ML IJ SOLN: 25.0000 ug | INTRAMUSCULAR | Status: DC | PRN

## 2014-07-25 MED ORDER — FENTANYL CITRATE 0.05 MG/ML IJ SOLN
INTRAMUSCULAR | Status: DC | PRN
  Administered 2014-07-25: 50 ug via INTRAVENOUS

## 2014-07-25 SURGICAL SUPPLY — 1 items: PILLOW ABDUCTION HIP (SOFTGOODS) ×3 IMPLANT

## 2014-07-25 NOTE — Discharge Instructions (Signed)
Use your brace.  Avoid hip extension and external rotation Post Anesthesia Home Care Instructions  Activity: Get plenty of rest for the remainder of the day. A responsible adult should stay with you for 24 hours following the procedure.  For the next 24 hours, DO NOT: -Drive a car -Advertising copywriter -Drink alcoholic beverages -Take any medication unless instructed by your physician -Make any legal decisions or sign important papers.  Meals: Start with liquid foods such as gelatin or soup. Progress to regular foods as tolerated. Avoid greasy, spicy, heavy foods. If nausea and/or vomiting occur, drink only clear liquids until the nausea and/or vomiting subsides. Call your physician if vomiting continues.  Special Instructions/Symptoms: Your throat may feel dry or sore from the anesthesia or the breathing tube placed in your throat during surgery. If this causes discomfort, gargle with warm salt water. The discomfort should disappear within 24 hours.

## 2014-07-25 NOTE — Anesthesia Preprocedure Evaluation (Addendum)
Anesthesia Evaluation  Patient identified by MRN, date of birth, ID band Patient awake    Reviewed: Allergy & Precautions, H&P , NPO status , Patient's Chart, lab work & pertinent test results  Airway Mallampati: II TM Distance: >3 FB Neck ROM: Full    Dental no notable dental hx. (+) Partial Lower, Upper Dentures, Dental Advisory Given   Pulmonary shortness of breath and with exertion, pneumonia -, resolved, COPD COPD inhaler, former smoker,  breath sounds clear to auscultation  Pulmonary exam normal       Cardiovascular hypertension, Pt. on medications Rhythm:Regular Rate:Normal     Neuro/Psych Anxiety negative neurological ROS     GI/Hepatic negative GI ROS, Neg liver ROS,   Endo/Other  Obesity  Renal/GU negative Renal ROS  negative genitourinary   Musculoskeletal  (+) Arthritis -, Osteoarthritis,  Dislocated right hip   Abdominal (+) + obese,   Peds  Hematology negative hematology ROS (+)   Anesthesia Other Findings   Reproductive/Obstetrics negative OB ROS                         Anesthesia Physical Anesthesia Plan  ASA: III and emergent  Anesthesia Plan: General   Post-op Pain Management:    Induction: Intravenous, Rapid sequence and Cricoid pressure planned  Airway Management Planned: Oral ETT  Additional Equipment:   Intra-op Plan:   Post-operative Plan: Extubation in OR  Informed Consent: I have reviewed the patients History and Physical, chart, labs and discussed the procedure including the risks, benefits and alternatives for the proposed anesthesia with the patient or authorized representative who has indicated his/her understanding and acceptance.   Dental advisory given  Plan Discussed with: CRNA, Anesthesiologist and Surgeon  Anesthesia Plan Comments:        Anesthesia Quick Evaluation

## 2014-07-25 NOTE — Op Note (Signed)
07/25/2014  10:20 PM  PATIENT:  Mike Lopez  73 y.o. male  PRE-OPERATIVE DIAGNOSIS:  Dislocated right total hip arthroplasty  POST-OPERATIVE DIAGNOSIS:  Same  PROCEDURE:  Closed reduction dislocated right total hip arthroplasty  SURGEON: Cliffton Asters. Janee Morn, MD  PHYSICIAN ASSISTANT: None  ANESTHESIA:  general  SPECIMENS:  None  DRAINS:   None  PREOPERATIVE INDICATIONS:  Mike Lopez is a  73 y.o. male with recurrent dislocated right total hip arthroplasty  The risks benefits and alternatives were discussed with the patient preoperatively including but not limited to the risks of infection, bleeding, nerve injury, cardiopulmonary complications, the need for revision surgery, among others, and the patient verbalized understanding and consented to proceed.  OPERATIVE IMPLANTS: None  OPERATIVE PROCEDURE:  The patient was escorted to the operative theatre and placed in a supine position.  A surgical "time-out" was performed during which the planned procedure, proposed operative site, and the correct patient identity were compared to the operative consent and agreement confirmed by the circulating nurse according to current facility policy. General anesthesia was administered.  With a couple different attempts, the patient's hip was ultimately felt manually to have reduced, and portable AP and lateral x-ray confirmed this. He was placed into an abduction pillow, awakened, and taken to recovery in stable condition  DISPOSITION: He'll be discharged home, again reinforcing dislocation precautions, to include the brace that he already has at home, with a plan to call Monday to arrange an appointment with Dr. Turner Daniels on Tuesday.

## 2014-07-25 NOTE — H&P (Signed)
ORTHOPAEDIC CONSULTATION HISTORY & PHYSICAL REQUESTING PHYSICIAN: Samirah Scarpati A Alena Blankenbeckler, MD  Chief Complaint: Dislocated right total hip replacement  HPI: Mike Lopez is a 73 y.o. male who was standing in his bathroom tonight, and eJodi Marbleright total hip dislocation. He has had a history of recurrent dislocation right hip. His initial and first revision surgery was performed in [facility] his total hip dislocated a week ago, and was not successfully reduced in the emergency department. He was subsequent transferred to [facility], where Dr. Ave Filter reduced it in the operating room. His hip was reduced with longitudinal traction, and his cup is noted to be anteverted. His wife contacted me by telephone indicating that the patient had again experienced a dislocation and was being transported by EMS from his home in [place] to [facility]. However, upon arrival at [facility], even though I had posted his case and had understood the operating room to have the availability and capacity, when the patient arrived there were 2 other cases considered more emergent, and likely to occupy the operating room for the next 4 hours. I subsequently made the decision to transfer the patient to [facility] where the operating room was open and available to decrease the risk of sequelae from prolonged total hip dislocation.  Past Medical History  Diagnosis Date  . Hypertension   . COPD (chronic obstructive pulmonary disease)     h/o emphysema, no oxygen in use   . Shortness of breath   . Pneumonia     hosp. several times for pneumonia   . Anxiety   . Indigestion       medicines bring on indigestion uses water to clear indigestion,  & takes his meds /with dinner rather than bedtime.   . Arthritis     hands, wrists,L knee joint    Past Surgical History  Procedure Laterality Date  . Nasal sinus surgery  1995  . Joint replacement      both shoudlers, Both hips, R knee  .  Leg surgery      post trauma, multiple surgries   . Hernia repair    . Hardware removal Right 06/05/2014    Procedure: HARDWARE REMOVAL;  Surgeon: Mable Paris, MD;  Location: [facility] OR;  Service: Orthopedics;  Laterality: Right;  Right glenoid component removal  . Irrigation and debridement shoulder Right 06/05/2014    Procedure: IRRIGATION AND DEBRIDEMENT SHOULDER;  Surgeon: Mable Paris, MD;  Location: [facility] OR;  Service: Orthopedics;  Laterality: Right;  . Hip closed reduction Right 07/12/2014    Procedure: CLOSED REDUCTION HIP;  Surgeon: Mable Paris, MD;  Location: [facility] OR;  Service: Orthopedics;  Laterality: Right;   History   Social History  . Marital Status: Married    Spouse Name: N/A    Number of Children: N/A  . Years of Education: N/A   Social History Main Topics  . Smoking status: Former Smoker    Types: Cigarettes    Quit date: 06/02/1989  . Smokeless tobacco: None  . Alcohol Use: No  . Drug Use: No  . Sexual Activity: None   Other Topics Concern  . None   Social History Narrative  . None   No family history on file. Allergies  Allergen Reactions  . Doxycycline Hyclate Other (See Comments)    "My skin comes over my penis."   Prior to Admission medications   Medication Sig Start Date End Date Taking? Authorizing Provider  albuterol (PROVENTIL) (2.5  MG/3ML) 0.083% nebulizer solution Take 2.5 mg by nebulization See admin instructions. Use every morning. If active during the evening repeat dose at bedtime.    Historical Provider, MD  amLODipine (NORVASC) 10 MG tablet Take 10 mg by mouth every morning.    Historical Provider, MD  aspirin EC 81 MG tablet Take 81 mg by mouth daily.    Historical Provider, MD  budesonide (PULMICORT) 0.25 MG/2ML nebulizer solution Take 0.25 mg by nebulization See admin instructions. Use every morning. If active during the evening repeat dose at bedtime.    Historical Provider, MD  cetirizine (KLS ALLER-TEC) 10 MG  tablet Take 10 mg by mouth daily.    Historical Provider, MD  Cholecalciferol (VITAMIN D3) 5000 UNITS TABS Take 5,000 Units by mouth daily.    Historical Provider, MD  Cyanocobalamin (VITAMIN B 12 PO) Take 5,000 mcg by mouth every morning.    Historical Provider, MD  diclofenac (VOLTAREN) 75 MG EC tablet Take 75 mg by mouth 2 (two) times daily.    Historical Provider, MD  docusate sodium (COLACE) 100 MG capsule Take 1 capsule (100 mg total) by mouth 3 (three) times daily as needed. 06/05/14   Jiles Harold, PA-C  folic acid (FOLVITE) 1 MG tablet Take 1 mg by mouth daily.    Historical Provider, MD  gabapentin (NEURONTIN) 300 MG capsule Take 600 mg by mouth at bedtime.    Historical Provider, MD  losartan (COZAAR) 25 MG tablet Take 25 mg by mouth every morning.    Historical Provider, MD  Multiple Vitamin (MULTIVITAMIN WITH MINERALS) TABS tablet Take 1 tablet by mouth daily.    Historical Provider, MD  NON FORMULARY Take 12 mg by mouth every morning. Hawaiian Astaxanthin    Historical Provider, MD  Omega-3 Fatty Acids (OMEGA 3 PO) Take 1,280 mg by mouth 2 (two) times daily.    Historical Provider, MD  OVER THE COUNTER MEDICATION Take 1 tablet by mouth every morning. Probiotic Eleven    Historical Provider, MD   No results found.  Positive ROS: All other systems have been reviewed and were otherwise negative with the exception of those mentioned in the HPI and as above.  Physical Exam: Vitals: Refer to EMR. Constitutional:  WD, WN, NAD HEENT:  NCAT, EOMI Neuro/Psych:  Alert & oriented to person, place, and time; appropriate mood & affect Lymphatic: No generalized extremity edema or lymphadenopathy Extremities / MSK:  The extremities are normal with respect to appearance, ranges of motion, joint stability, muscle strength/tone, sensation, & perfusion except as otherwise noted:  Right lower extremity noted to be externally rotated and a little shortened. He can flex the ankle up and down, has  intact light touch sensation on plantar and dorsal surfaces of the foot including the first web space and palpable dorsalis pedis pulse. He has pain with passive rotation of the hip, but none with palpation about the knee distally.  Assessment: Dislocated right total hip arthroplasty  Plan: With the appropriate degree of anesthesia/muscle relaxation, I plan to reduce the hip, confirm such with x-ray, and ultimately discharged the patient home with a plan to followup with Dr. Turner Daniels on Tuesday.  Mike Asters Janee Morn, MD      Orthopaedic & Hand Surgery [facility] [address] [place], [place]  [address] Office: [phone] Mobile: [phone]

## 2014-07-25 NOTE — Transfer of Care (Signed)
Immediate Anesthesia Transfer of Care Note  Patient: Mike Lopez  Procedure(s) Performed: Procedure(s): CLOSED MANIPULATION HIP (Right)  Patient Location: PACU  Anesthesia Type:General  Level of Consciousness: awake, alert , sedated and patient cooperative  Airway & Oxygen Therapy: Patient Spontanous Breathing and Patient connected to face mask oxygen  Post-op Assessment: Report given to PACU RN and Post -op Vital signs reviewed and stable  Post vital signs: Reviewed and stable  Complications: No apparent anesthesia complications

## 2014-07-25 NOTE — Anesthesia Postprocedure Evaluation (Signed)
  Anesthesia Post-op Note  Patient: Mike Lopez  Procedure(s) Performed: Procedure(s): CLOSED MANIPULATION HIP (Right)  Patient Location: PACU  Anesthesia Type:General  Level of Consciousness: awake, alert  and oriented  Airway and Oxygen Therapy: Patient Spontanous Breathing  Post-op Pain: none  Post-op Assessment: Post-op Vital signs reviewed, Patient's Cardiovascular Status Stable, Respiratory Function Stable, Patent Airway, No signs of Nausea or vomiting and Pain level controlled  Post-op Vital Signs: Reviewed and stable  Last Vitals: There were no vitals filed for this visit.  Complications: No apparent anesthesia complications

## 2014-07-28 ENCOUNTER — Encounter (HOSPITAL_COMMUNITY): Payer: Self-pay | Admitting: Orthopedic Surgery

## 2015-01-29 IMAGING — RF DG HIP OPERATIVE*R*
1 series · 1 of 1 positions shown · non-contrast
Comparison: 07/12/2014

CLINICAL DATA: THR dislocation.

EXAM:
DG OPERATIVE RIGHT HIP
TECHNIQUE: A single spot fluoroscopic AP image of the right hip is submitted.

[Series 1: run · 1 of 1 slices shown]
[im 1/1]
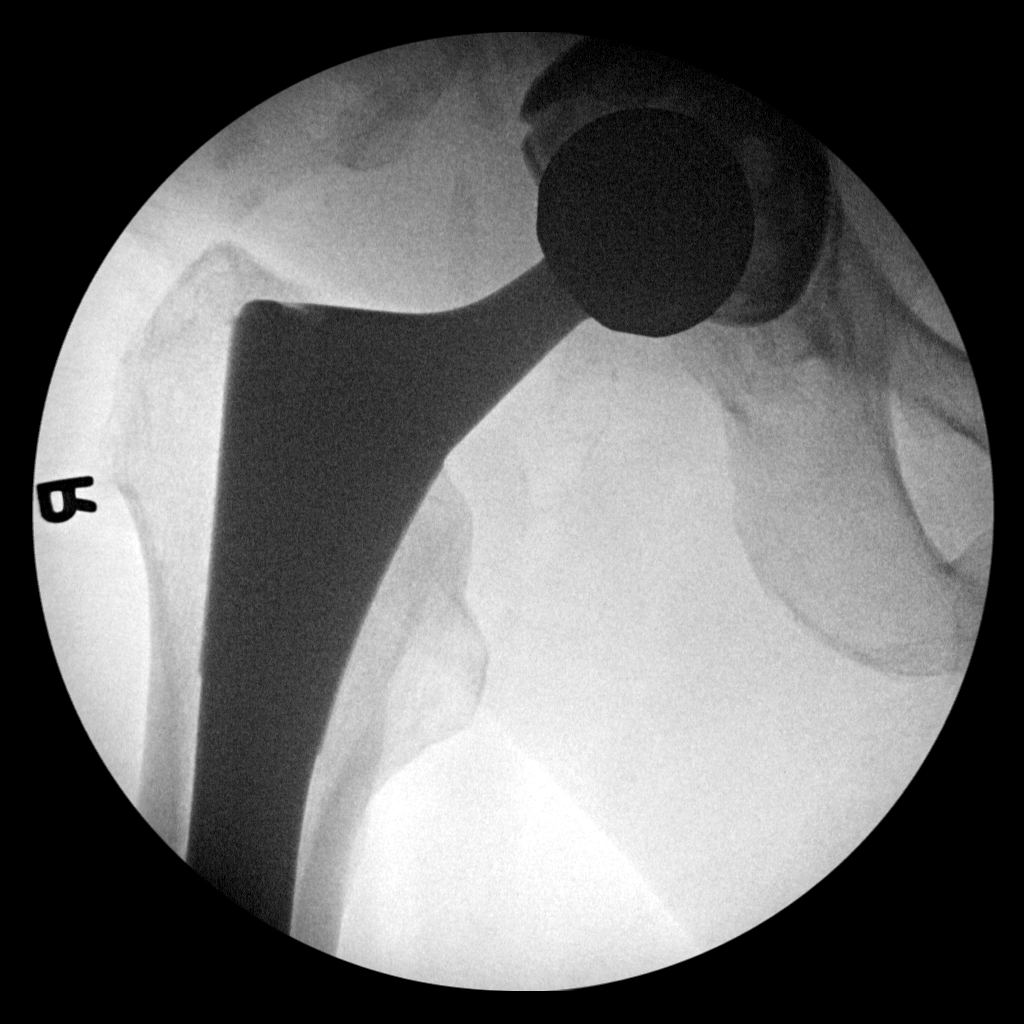

[1 of 1 positions shown; findings below may reference images not displayed]

FINDINGS: There has been closed reduction of the previously demonstrated
superior hip dislocation. No adverse features.
IMPRESSION: As above.

## 2015-02-11 IMAGING — DX DG HIP 1V PORT*R*
2 series · 2 of 2 positions shown · non-contrast
Comparison: 07/12/2014

CLINICAL DATA: Postreduction views

EXAM:
PORTABLE RIGHT HIP - 1 VIEW

[hip x-table]
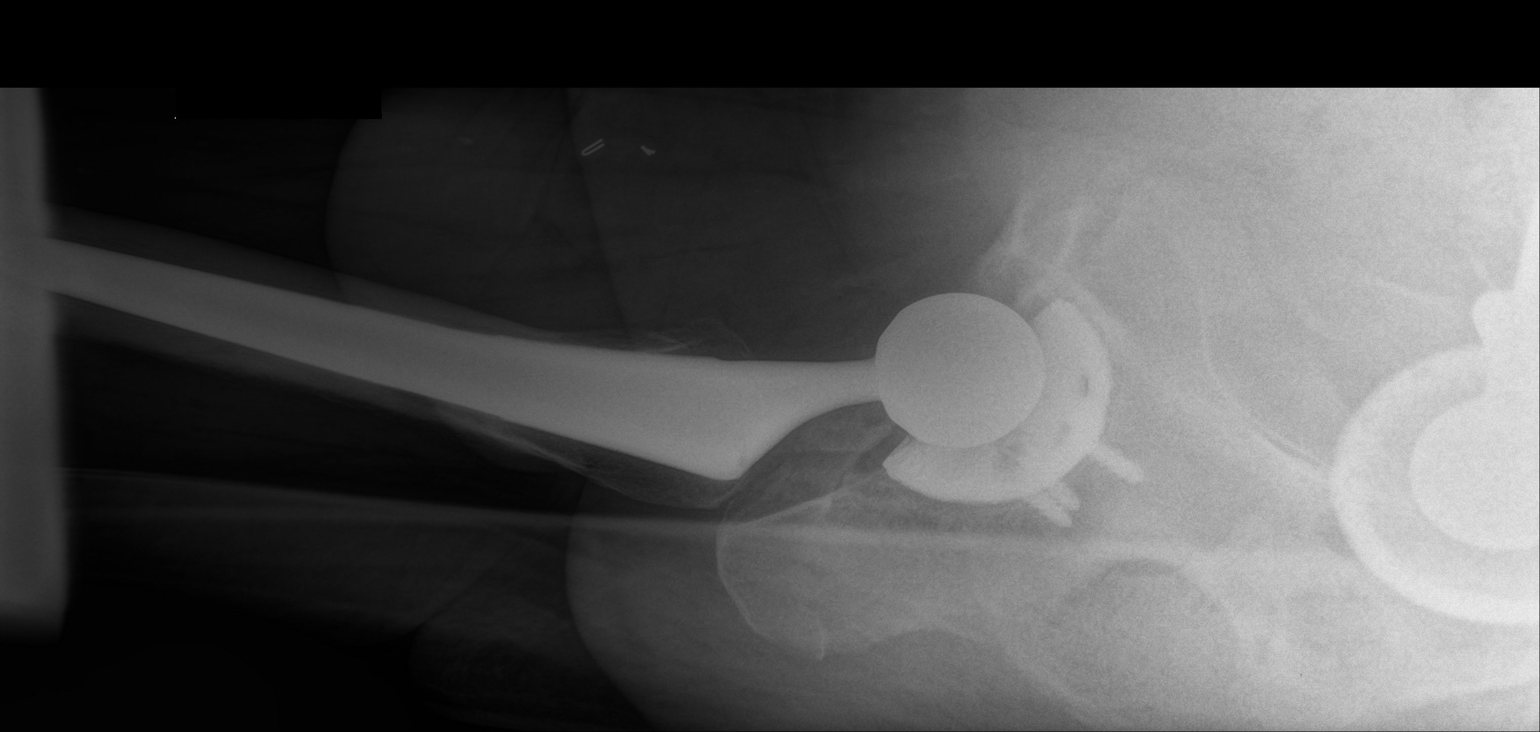

[hip ap]
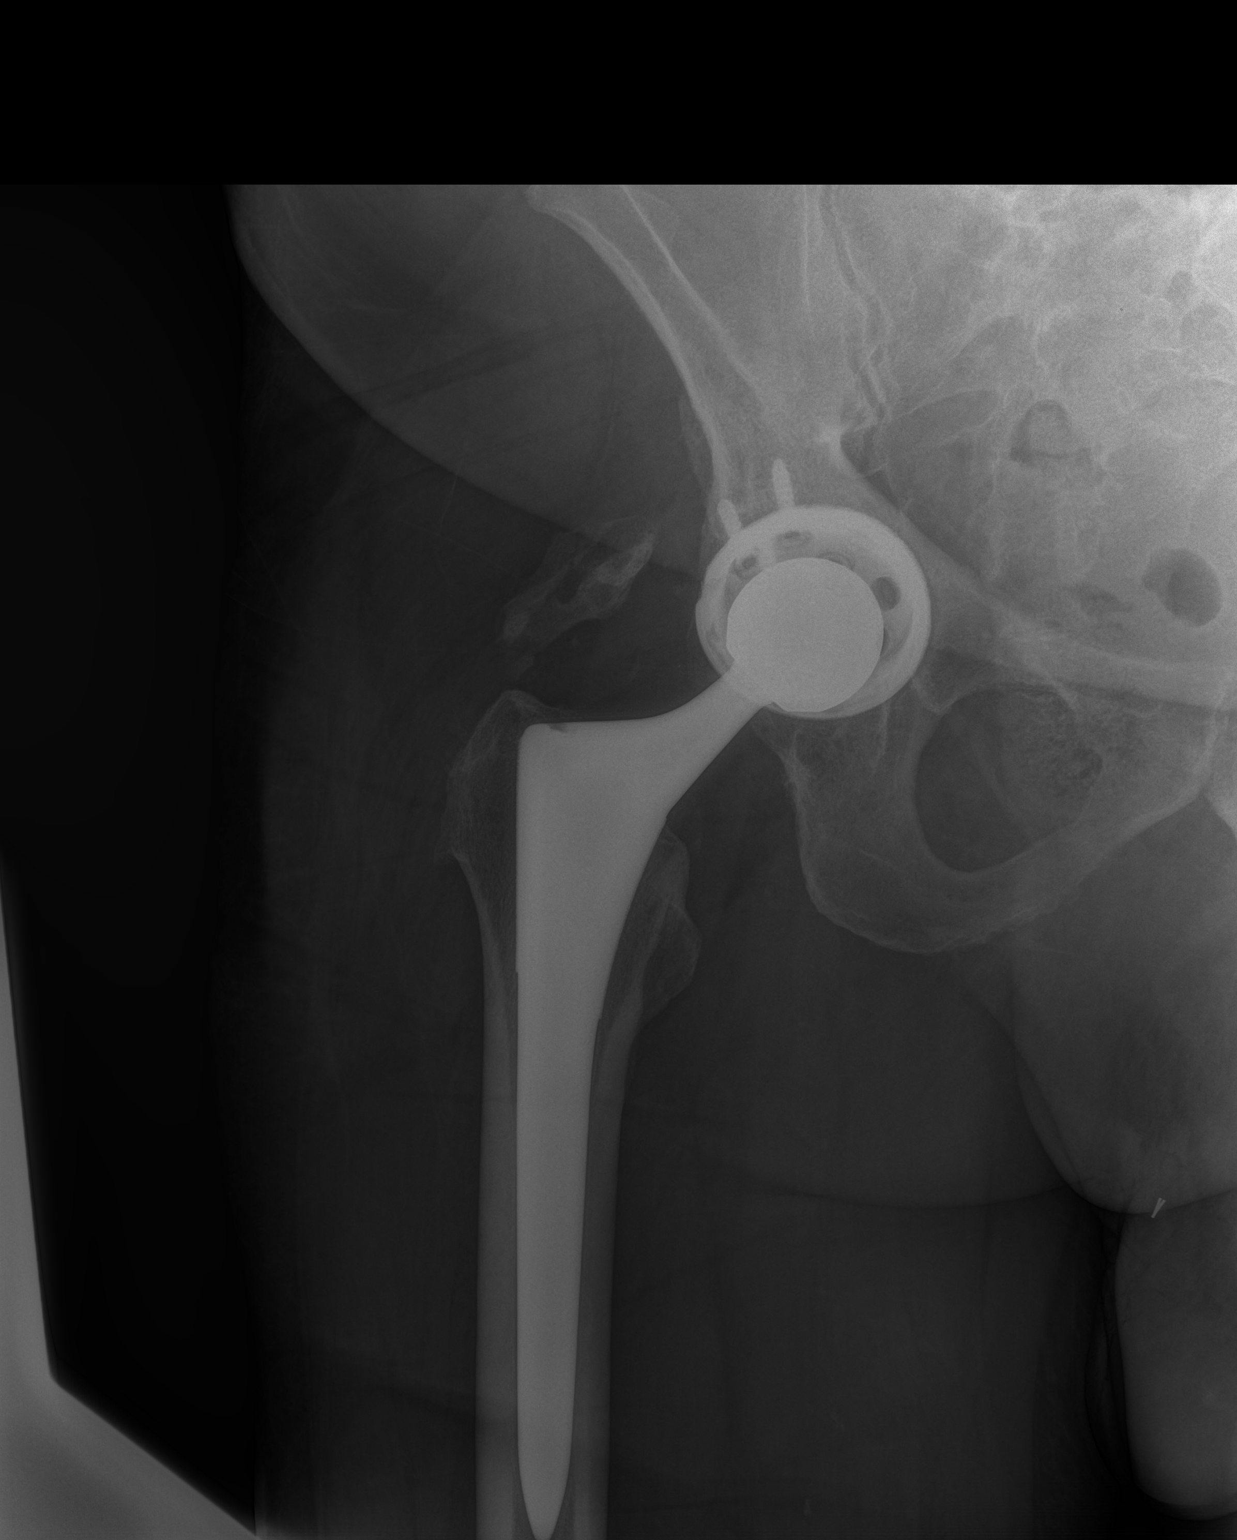

[2 of 2 positions shown; findings below may reference images not displayed]

FINDINGS: Right total hip arthroplasty. Shallow appearing acetabular angle
anteriorly. No evidence of dislocation of the prosthesis.
Heterotopic ossification lateral to the hip. No acute fractures.
IMPRESSION: Right hip arthroplasty appears located.  No acute fractures.

## 2024-02-27 DEATH — deceased
